# Patient Record
Sex: Female | Born: 1975 | Race: White | Hispanic: No | Marital: Married | Smoking: Never smoker
Health system: Southern US, Community
[De-identification: ages and names within clinical notes are randomized; demographics above are authoritative.]

## PROBLEM LIST (undated history)

## (undated) DIAGNOSIS — J349 Unspecified disorder of nose and nasal sinuses: Secondary | ICD-10-CM

## (undated) DIAGNOSIS — I10 Essential (primary) hypertension: Secondary | ICD-10-CM

## (undated) DIAGNOSIS — E8801 Alpha-1-antitrypsin deficiency: Secondary | ICD-10-CM

## (undated) DIAGNOSIS — R112 Nausea with vomiting, unspecified: Secondary | ICD-10-CM

## (undated) DIAGNOSIS — G43909 Migraine, unspecified, not intractable, without status migrainosus: Secondary | ICD-10-CM

## (undated) HISTORY — PX: WISDOM TOOTH EXTRACTION: SHX21

## (undated) HISTORY — DX: Unspecified disorder of nose and nasal sinuses: J34.9

## (undated) HISTORY — PX: BREAST BIOPSY: SHX20

---

## 2006-08-16 ENCOUNTER — Inpatient Hospital Stay (HOSPITAL_COMMUNITY): Admission: AD | Admit: 2006-08-16 | Discharge: 2006-08-18 | Payer: Self-pay | Admitting: Obstetrics and Gynecology

## 2008-04-29 DIAGNOSIS — C4491 Basal cell carcinoma of skin, unspecified: Secondary | ICD-10-CM

## 2008-04-29 HISTORY — DX: Basal cell carcinoma of skin, unspecified: C44.91

## 2008-05-04 ENCOUNTER — Ambulatory Visit: Payer: Self-pay | Admitting: Family Medicine

## 2009-01-24 DIAGNOSIS — D239 Other benign neoplasm of skin, unspecified: Secondary | ICD-10-CM

## 2009-01-24 HISTORY — DX: Other benign neoplasm of skin, unspecified: D23.9

## 2009-05-27 HISTORY — PX: NASAL SINUS SURGERY: SHX719

## 2010-01-25 ENCOUNTER — Ambulatory Visit: Payer: Self-pay | Admitting: Otolaryngology

## 2010-05-27 HISTORY — PX: BREAST BIOPSY: SHX20

## 2010-11-15 ENCOUNTER — Ambulatory Visit: Payer: Self-pay | Admitting: Internal Medicine

## 2010-12-24 ENCOUNTER — Other Ambulatory Visit: Payer: Self-pay | Admitting: Obstetrics and Gynecology

## 2010-12-24 DIAGNOSIS — R928 Other abnormal and inconclusive findings on diagnostic imaging of breast: Secondary | ICD-10-CM

## 2011-01-03 ENCOUNTER — Ambulatory Visit
Admission: RE | Admit: 2011-01-03 | Discharge: 2011-01-03 | Disposition: A | Discharge status: Home / Self Care | Payer: BC Managed Care – PPO | Source: Ambulatory Visit | Attending: Obstetrics and Gynecology | Admitting: Obstetrics and Gynecology

## 2011-01-03 DIAGNOSIS — R928 Other abnormal and inconclusive findings on diagnostic imaging of breast: Secondary | ICD-10-CM

## 2011-01-08 ENCOUNTER — Other Ambulatory Visit: Payer: Self-pay | Admitting: Obstetrics and Gynecology

## 2011-01-08 DIAGNOSIS — D249 Benign neoplasm of unspecified breast: Secondary | ICD-10-CM

## 2011-01-15 ENCOUNTER — Ambulatory Visit
Admission: RE | Admit: 2011-01-15 | Discharge: 2011-01-15 | Disposition: A | Discharge status: Home / Self Care | Payer: BC Managed Care – PPO | Source: Ambulatory Visit | Attending: Obstetrics and Gynecology | Admitting: Obstetrics and Gynecology

## 2011-01-15 DIAGNOSIS — D249 Benign neoplasm of unspecified breast: Secondary | ICD-10-CM

## 2011-08-06 LAB — HM PAP SMEAR

## 2011-10-06 LAB — HM MAMMOGRAPHY: HM Mammogram: NORMAL

## 2012-03-16 ENCOUNTER — Encounter: Payer: Self-pay | Admitting: Internal Medicine

## 2012-04-04 IMAGING — US US CORE BIOPSY
1 series · 8 of 8 positions shown · non-contrast
Comparison: none

***ADDENDUM*** CREATED: 01/17/2011 [DATE]

Pathology revealed a fibroadenoma in the left breast. This was
found to be concordant by Dr. Nelia Salie. Pathology was
relayed by telephone. The patient reported doing well after the
biopsy. Post biopsy instructions were reviewed and her questions
were answered. She was encouraged to call The [REDACTED] for any additional concerns. She was asked to
continue with monthly self breast examination and to return at age
39 for screening mammography.
Pathology results are dictated by Charalambia Saparilla RN, BSN on [address]
***END ADDENDUM*** SIGNED BY: Olefhile Koitsiwe, M.D.
CLINICAL DATA: Left breast mass, probable fibroadenoma.  The
patient requests biopsy.

[Series 1: us core biopsy · 8 of 8 slices shown]
[im 1/8]
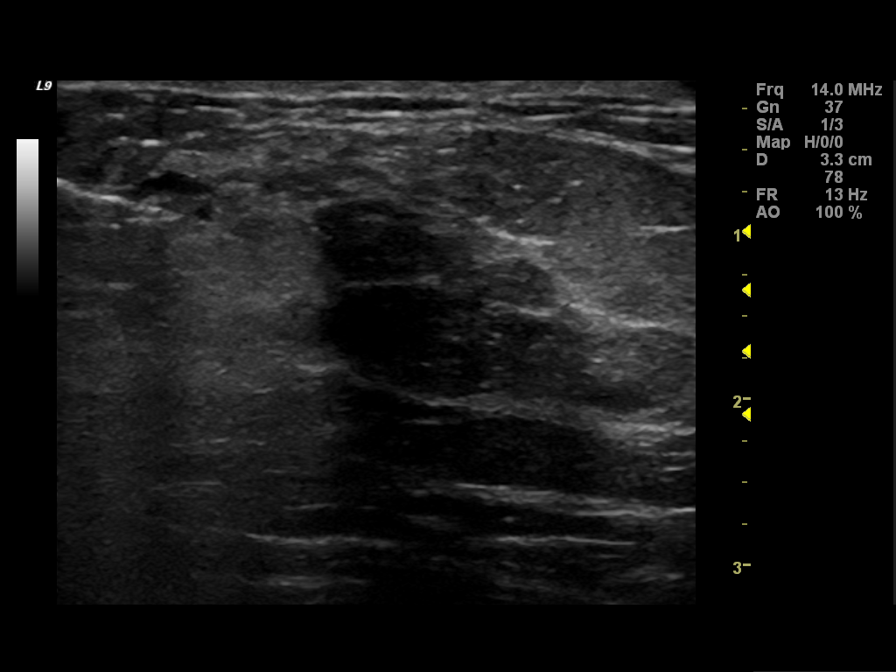
[im 2/8]
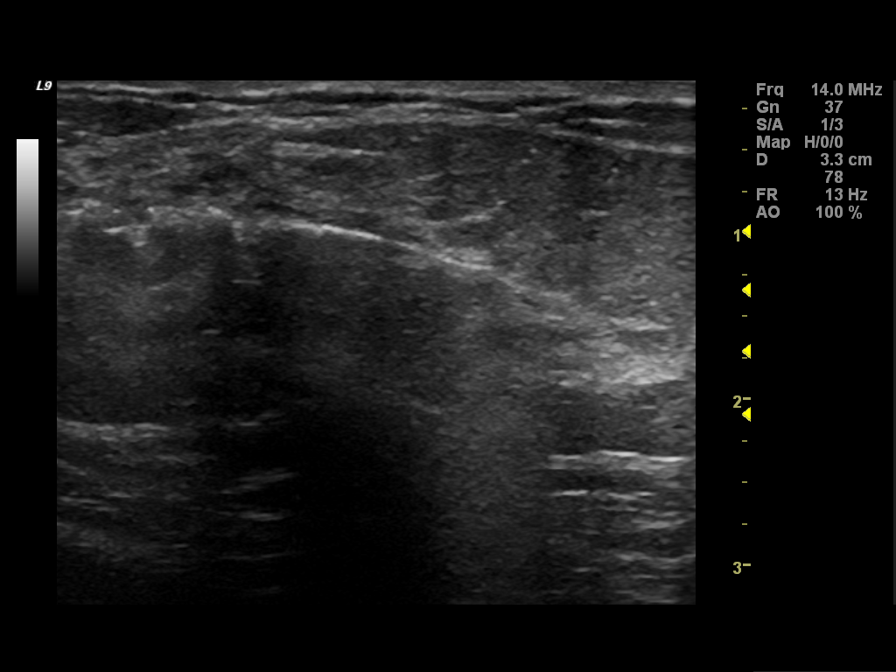
[im 3/8]
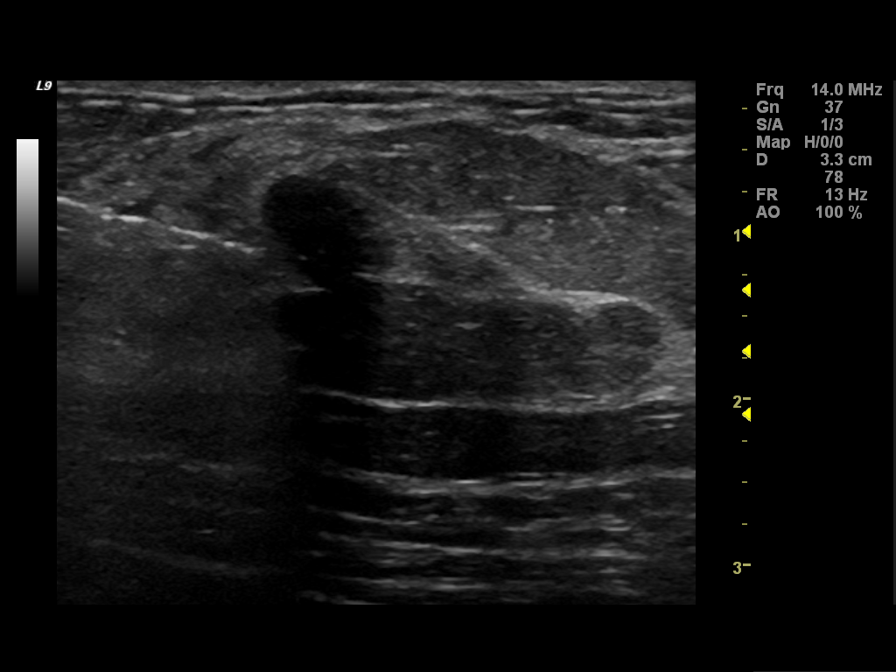
[im 4/8]
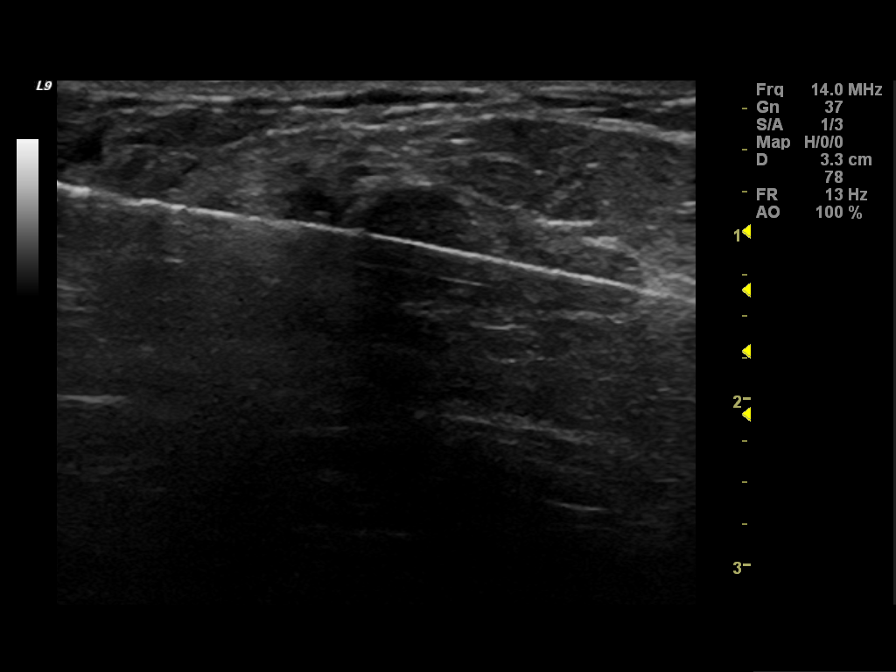
[im 5/8]
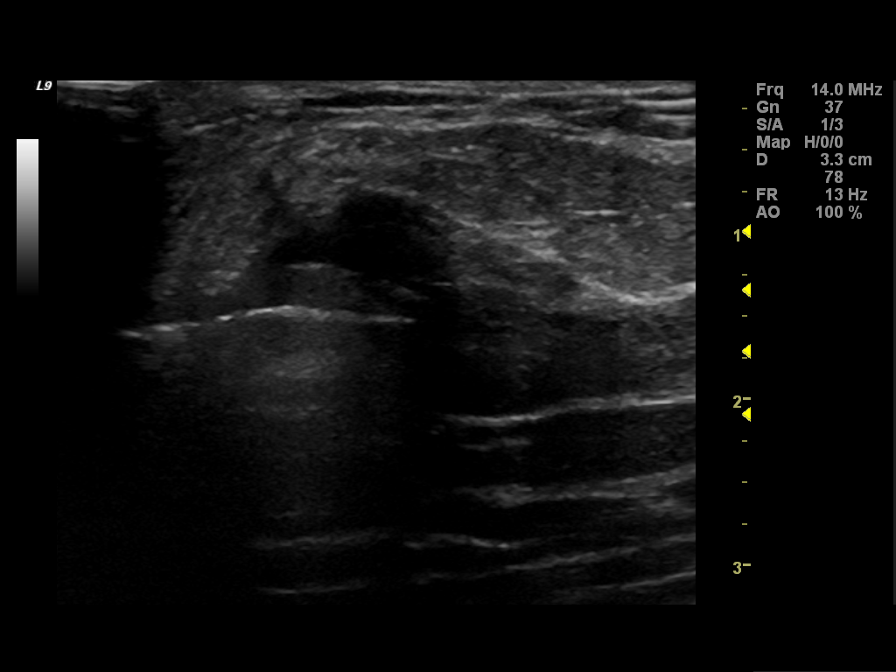
[im 6/8]
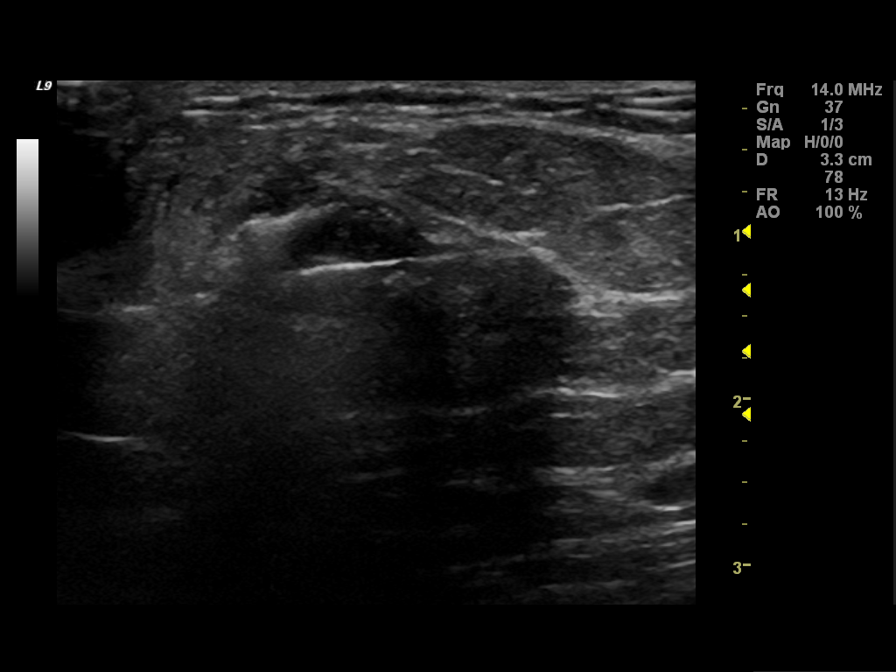
[im 7/8]
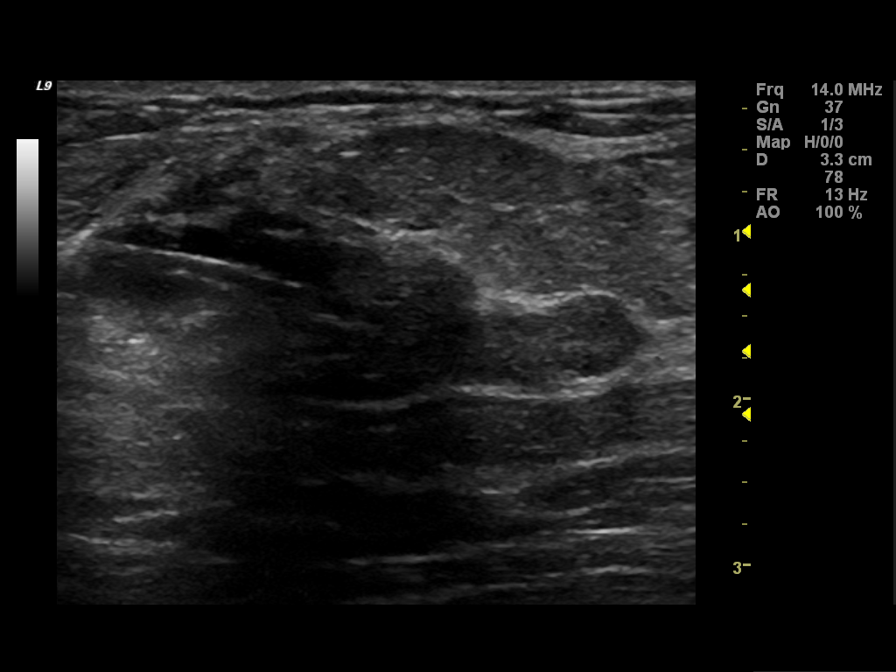
[im 8/8]
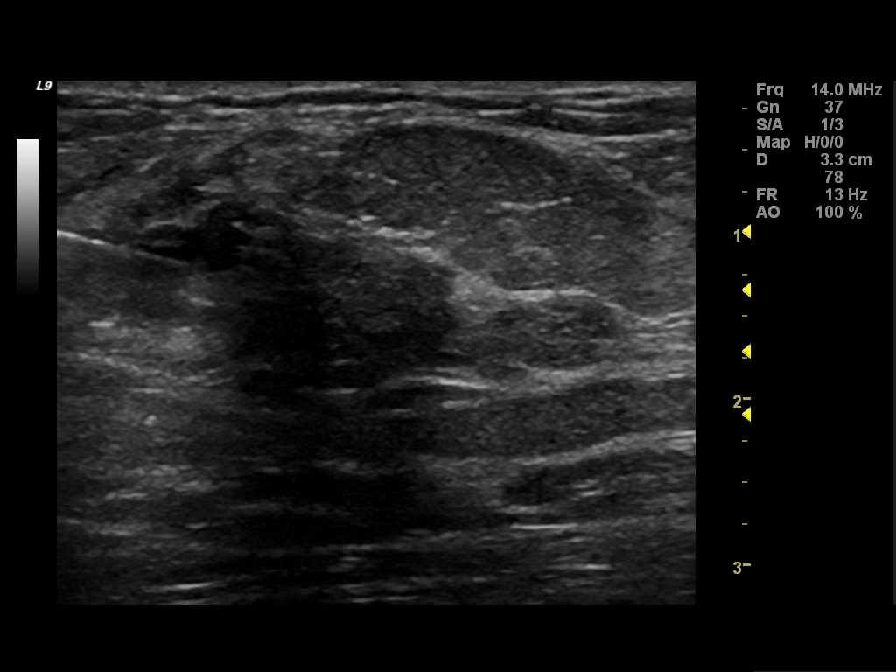

[8 of 8 positions shown; findings below may reference images not displayed]

ULTRASOUND GUIDED CORE BIOPSY OF THE LEFT BREAST

I met with the patient, and we discussed the procedure of
ultrasound-guided biopsy, including risks, benefits, and
alternatives.  Specifically, we discussed the risks of infection,
bleeding, tissue injury, clip migration, and inadequate sampling.
Informed, written consent was given.

Using sterile technique,   lidocaine, ultrasound guidance, and a 14
gauge automated biopsy device, biopsy was performed of mass in the
left breast nine o'clock position.  At the conclusion of the
procedure, a tissue marker clip was deployed into the biopsy
cavity.  Follow-up 2-view mammogram was performed and dictated
separately.
IMPRESSION: Ultrasound-guided biopsy of left breast.  No apparent
complications.

## 2012-08-05 ENCOUNTER — Encounter: Payer: Self-pay | Admitting: Internal Medicine

## 2012-08-05 ENCOUNTER — Ambulatory Visit (INDEPENDENT_AMBULATORY_CARE_PROVIDER_SITE_OTHER): Payer: BC Managed Care – PPO | Admitting: Internal Medicine

## 2012-08-05 VITALS — BP 132/98 | HR 88 | Temp 98.2°F | Ht 63.0 in | Wt 169.0 lb

## 2012-08-05 DIAGNOSIS — J309 Allergic rhinitis, unspecified: Secondary | ICD-10-CM

## 2012-08-05 DIAGNOSIS — Z0000 Encounter for general adult medical examination without abnormal findings: Secondary | ICD-10-CM

## 2012-08-05 DIAGNOSIS — R030 Elevated blood-pressure reading, without diagnosis of hypertension: Secondary | ICD-10-CM

## 2012-08-05 LAB — CBC WITH DIFFERENTIAL/PLATELET
Basophils Absolute: 0.1 10*3/uL (ref 0.0–0.1)
Eosinophils Absolute: 0.6 10*3/uL (ref 0.0–0.7)
Lymphocytes Relative: 33.7 % (ref 12.0–46.0)
MCHC: 33.7 g/dL (ref 30.0–36.0)
Neutrophils Relative %: 50.5 % (ref 43.0–77.0)
Platelets: 347 10*3/uL (ref 150.0–400.0)
RDW: 13.1 % (ref 11.5–14.6)

## 2012-08-05 LAB — LIPID PANEL
HDL: 36.3 mg/dL — ABNORMAL LOW (ref >39.00)
Total CHOL/HDL Ratio: 5
VLDL: 35.4 mg/dL (ref 0.0–40.0)

## 2012-08-05 LAB — TSH: TSH: 0.98 u[IU]/mL (ref 0.35–5.50)

## 2012-08-05 LAB — MICROALBUMIN / CREATININE URINE RATIO
Creatinine,U: 298.1 mg/dL
Microalb Creat Ratio: 0.8 mg/g (ref 0.0–30.0)
Microalb, Ur: 2.3 mg/dL — ABNORMAL HIGH (ref 0.0–1.9)

## 2012-08-05 LAB — COMPREHENSIVE METABOLIC PANEL
ALT: 36 U/L — ABNORMAL HIGH (ref 0–35)
AST: 36 U/L (ref 0–37)
Calcium: 9.2 mg/dL (ref 8.4–10.5)
Chloride: 106 mEq/L (ref 96–112)
Creatinine, Ser: 1 mg/dL (ref 0.4–1.2)
Sodium: 138 mEq/L (ref 135–145)
Total Bilirubin: 0.6 mg/dL (ref 0.3–1.2)
Total Protein: 7.6 g/dL (ref 6.0–8.3)

## 2012-08-05 MED ORDER — AZELASTINE-FLUTICASONE 137-50 MCG/ACT NA SUSP: 1.0000 | Freq: Every day | NASAL | Status: DC

## 2012-08-05 NOTE — Progress Notes (Signed)
Subjective:    Patient ID: Barbara Ortega, female    DOB: February 21, 1976, 37 y.o.   MRN: [id number]  HPI 37 year old female with history of allergic rhinitis presents to reestablish care. She was last seen in her previous office in 2012. She reports she is generally been doing well. However, over the last couple of months she has had increased symptoms of allergic rhinitis with clear nasal drainage, postnasal drip, and dry cough. The symptoms are exacerbated when working outdoors and with exercise. She occasionally notes some wheezing. She does not have a history of asthma. She denies shortness of breath. She is not a smoker.  She also notes that she was recently seen by her OB/GYN and was told that her blood pressure was slightly elevated. Her mother has a history of hypertension. When checking her blood pressure at home, it is typically 120s over 80s to 90s. She denies any headache, palpitation, chest pain.  Outpatient Encounter Prescriptions as of 08/05/2012  Medication Sig Dispense Refill  . etonogestrel-ethinyl estradiol (NUVARING) 0.12-0.015 MG/24HR vaginal ring Place 1 each vaginally every 28 (twenty-eight) days. Insert vaginally and leave in place for 3 consecutive weeks, then remove for 1 week.      . [DISCONTINUED] fluticasone (FLONASE) 50 MCG/ACT nasal spray Place 2 sprays into the nose daily.      . Azelastine-Fluticasone 137-50 MCG/ACT SUSP Place 1 spray into the nose daily.  23 g  3   No facility-administered encounter medications on file as of 08/05/2012.   BP 132/98  Pulse 88  Temp(Src) 98.2 F (36.8 C) (Oral)  Ht 5\' 3"  (1.6 m)  Wt 169 lb (76.658 kg)  BMI 29.94 kg/m2  SpO2 98%  LMP 08/02/2012  Review of Systems  Constitutional: Negative for fever, chills, appetite change, fatigue and unexpected weight change.  HENT: Positive for rhinorrhea, sneezing and postnasal drip. Negative for ear pain, congestion, sore throat, trouble swallowing, neck pain, voice change and sinus  pressure.   Eyes: Negative for visual disturbance.  Respiratory: Positive for cough and wheezing. Negative for shortness of breath and stridor.   Cardiovascular: Negative for chest pain, palpitations and leg swelling.  Gastrointestinal: Negative for nausea, vomiting, abdominal pain, diarrhea, constipation, blood in stool, abdominal distention and anal bleeding.  Genitourinary: Negative for dysuria and flank pain.  Musculoskeletal: Negative for myalgias, arthralgias and gait problem.  Skin: Negative for color change and rash.  Neurological: Negative for dizziness and headaches.  Hematological: Negative for adenopathy. Does not bruise/bleed easily.  Psychiatric/Behavioral: Negative for suicidal ideas, sleep disturbance and dysphoric mood. The patient is not nervous/anxious.        Objective:   Physical Exam  Constitutional: She is oriented to person, place, and time. She appears well-developed and well-nourished. No distress.  HENT:  Head: Normocephalic and atraumatic.  Right Ear: Tympanic membrane, external ear and ear canal normal.  Left Ear: Tympanic membrane, external ear and ear canal normal.  Nose: Mucosal edema present.  Mouth/Throat: Oropharynx is clear and moist. No oropharyngeal exudate.  Eyes: Conjunctivae are normal. Pupils are equal, round, and reactive to light. Right eye exhibits no discharge. Left eye exhibits no discharge. No scleral icterus.  Neck: Normal range of motion. Neck supple. No tracheal deviation present. No thyromegaly present.  Cardiovascular: Normal rate, regular rhythm, normal heart sounds and intact distal pulses.  Exam reveals no gallop and no friction rub.   No murmur heard. Pulmonary/Chest: Effort normal and breath sounds normal. No respiratory distress. She has no wheezes. She has  no rales. She exhibits no tenderness.  Musculoskeletal: Normal range of motion. She exhibits no edema and no tenderness.  Lymphadenopathy:    She has no cervical adenopathy.   Neurological: She is alert and oriented to person, place, and time. No cranial nerve deficit. She exhibits normal muscle tone. Coordination normal.  Skin: Skin is warm and dry. No rash noted. She is not diaphoretic. No erythema. No pallor.  Psychiatric: She has a normal mood and affect. Her behavior is normal. Judgment and thought content normal.          Assessment & Plan:

## 2012-08-05 NOTE — Assessment & Plan Note (Signed)
Symptoms and exam are most consistent with allergic rhinitis. No improvement with Claritin, Zyrtec or Flonase. Will try changing to Dymysta. If no improvement, will set up repeat ENT evaluation and allergy testing.

## 2012-08-05 NOTE — Assessment & Plan Note (Signed)
Blood pressure noted to be elevated today and was reportedly elevated at her OB/GYN appointment. We'll have her continue to monitor at home. Will check renal function and urine microalbumin with labs today. Followup in one month for recheck.

## 2012-09-07 ENCOUNTER — Ambulatory Visit (INDEPENDENT_AMBULATORY_CARE_PROVIDER_SITE_OTHER): Payer: BC Managed Care – PPO | Admitting: Internal Medicine

## 2012-09-07 ENCOUNTER — Encounter: Payer: Self-pay | Admitting: Internal Medicine

## 2012-09-07 VITALS — BP 144/98 | HR 90 | Temp 98.7°F | Wt 171.0 lb

## 2012-09-07 DIAGNOSIS — I10 Essential (primary) hypertension: Secondary | ICD-10-CM | POA: Insufficient documentation

## 2012-09-07 DIAGNOSIS — J309 Allergic rhinitis, unspecified: Secondary | ICD-10-CM

## 2012-09-07 DIAGNOSIS — R030 Elevated blood-pressure reading, without diagnosis of hypertension: Secondary | ICD-10-CM

## 2012-09-07 MED ORDER — HYDROCHLOROTHIAZIDE 12.5 MG PO CAPS: 12.5000 mg | ORAL_CAPSULE | Freq: Every day | ORAL | Status: DC

## 2012-09-07 NOTE — Assessment & Plan Note (Signed)
Persistent symptoms despite use of nonsedating antihistamine and nasal steroid. Will set up reevaluation with ENT. Question if she may have sinus obstruction leading to persistent symptoms.

## 2012-09-07 NOTE — Assessment & Plan Note (Signed)
BP Readings from Last 3 Encounters:  09/07/12 144/98  08/05/12 132/98   Blood pressure, specifically diastolic blood pressure has been elevated both in clinic and on check at home. Patient also has lower extremity edema. Will start low-dose hydrochlorothiazide. We'll also get renal artery Doppler given early onset of elevated blood pressures. Followup in one month for recheck.

## 2012-09-07 NOTE — Progress Notes (Signed)
Subjective:    Patient ID: Barbara Ortega, female    DOB: [birth year], 37 y.o.   MRN: [id number]  HPI 37 year old female with history of allergic rhinitis and elevated blood pressure presents for followup. At her last visit, she noted frequent symptoms of sneezing, nasal congestion. She has tried using several antihistamines including Zyrtec, Claritin, and Allegra. She reports no improvement with this. At her last visit, we also tried using the Dymista. She also notes no improvement with this. In the past, she was evaluated by ENT physician and had allergy testing which she reports was normal except for single allergy to grass. She reports persistent nasal congestion with occasional purulent sinus drainage. She also has occasional cough which is made worse by physical activity.  In regards to elevated blood pressure, she notes blood pressures typically 120s to 140s over 90-100. She denies any chest pain, headache, palpitations.  Outpatient Encounter Prescriptions as of 09/07/2012  Medication Sig Dispense Refill  . Azelastine-Fluticasone 137-50 MCG/ACT SUSP Place 1 spray into the nose daily.  23 g  3  . cetirizine (ZYRTEC) 10 MG tablet Take 10 mg by mouth daily.      Marland Kitchen etonogestrel-ethinyl estradiol (NUVARING) 0.12-0.015 MG/24HR vaginal ring Place 1 each vaginally every 28 (twenty-eight) days. Insert vaginally and leave in place for 3 consecutive weeks, then remove for 1 week.      . hydrochlorothiazide (MICROZIDE) 12.5 MG capsule Take 1 capsule (12.5 mg total) by mouth daily.  30 capsule  6   No facility-administered encounter medications on file as of 09/07/2012.   BP 144/98  Pulse 90  Temp(Src) 98.7 F (37.1 C) (Oral)  Wt 171 lb (77.565 kg)  BMI 30.3 kg/m2  SpO2 99%  LMP 08/31/2012  Review of Systems  Constitutional: Negative for fever, chills, appetite change, fatigue and unexpected weight change.  HENT: Positive for congestion, rhinorrhea, sneezing and postnasal drip. Negative for  ear pain, sore throat, trouble swallowing, neck pain, voice change and sinus pressure.   Eyes: Negative for visual disturbance.  Respiratory: Negative for cough, shortness of breath, wheezing and stridor.   Cardiovascular: Negative for chest pain, palpitations and leg swelling.  Gastrointestinal: Negative for nausea, vomiting, abdominal pain, diarrhea, constipation, blood in stool, abdominal distention and anal bleeding.  Genitourinary: Negative for dysuria and flank pain.  Musculoskeletal: Negative for myalgias, arthralgias and gait problem.  Skin: Negative for color change and rash.  Neurological: Negative for dizziness and headaches.  Hematological: Negative for adenopathy. Does not bruise/bleed easily.  Psychiatric/Behavioral: Negative for suicidal ideas, sleep disturbance and dysphoric mood. The patient is not nervous/anxious.        Objective:   Physical Exam  Constitutional: She is oriented to person, place, and time. She appears well-developed and well-nourished. No distress.  HENT:  Head: Normocephalic and atraumatic.  Right Ear: External ear normal.  Left Ear: External ear normal.  Nose: Mucosal edema present. Right sinus exhibits no maxillary sinus tenderness and no frontal sinus tenderness. Left sinus exhibits no maxillary sinus tenderness and no frontal sinus tenderness.  Mouth/Throat: Oropharynx is clear and moist. No oropharyngeal exudate.  Eyes: Conjunctivae are normal. Pupils are equal, round, and reactive to light. Right eye exhibits no discharge. Left eye exhibits no discharge. No scleral icterus.  Neck: Normal range of motion. Neck supple. No tracheal deviation present. No thyromegaly present.  Cardiovascular: Normal rate, regular rhythm, normal heart sounds and intact distal pulses.  Exam reveals no gallop and no friction rub.   No murmur heard.  Pulmonary/Chest: Effort normal and breath sounds normal. No respiratory distress. She has no wheezes. She has no rales. She  exhibits no tenderness.  Musculoskeletal: Normal range of motion. She exhibits no edema and no tenderness.  Lymphadenopathy:    She has no cervical adenopathy.  Neurological: She is alert and oriented to person, place, and time. No cranial nerve deficit. She exhibits normal muscle tone. Coordination normal.  Skin: Skin is warm and dry. No rash noted. She is not diaphoretic. No erythema. No pallor.  Psychiatric: She has a normal mood and affect. Her behavior is normal. Judgment and thought content normal.          Assessment & Plan:

## 2012-09-18 ENCOUNTER — Telehealth: Payer: Self-pay | Admitting: Internal Medicine

## 2012-09-18 NOTE — Telephone Encounter (Signed)
Ultrasound of the renal arteries 4/23 was normal.

## 2012-09-25 LAB — HM MAMMOGRAPHY

## 2012-09-25 LAB — HM PAP SMEAR

## 2012-10-05 ENCOUNTER — Encounter: Payer: Self-pay | Admitting: Internal Medicine

## 2012-10-05 ENCOUNTER — Ambulatory Visit (INDEPENDENT_AMBULATORY_CARE_PROVIDER_SITE_OTHER): Payer: BC Managed Care – PPO | Admitting: Internal Medicine

## 2012-10-05 VITALS — BP 130/90 | HR 89 | Temp 98.3°F | Wt 173.0 lb

## 2012-10-05 DIAGNOSIS — I10 Essential (primary) hypertension: Secondary | ICD-10-CM

## 2012-10-05 NOTE — Progress Notes (Signed)
Subjective:    Patient ID: Barbara Ortega, female    DOB: 1975-06-10, 37 y.o.   MRN: [id number]  HPI 37YO female with h/o hypertension presents for follow up. Doing well. Tolerated HCTZ well. Notes improvement in frequency and intensity of headaches. BP generally 120-130/90s. No new concerns today.  Outpatient Encounter Prescriptions as of 10/05/2012  Medication Sig Dispense Refill  . Azelastine-Fluticasone 137-50 MCG/ACT SUSP Place 1 spray into the nose daily.  23 g  3  . cetirizine (ZYRTEC) 10 MG tablet Take 10 mg by mouth daily.      Marland Kitchen etonogestrel-ethinyl estradiol (NUVARING) 0.12-0.015 MG/24HR vaginal ring Place 1 each vaginally every 28 (twenty-eight) days. Insert vaginally and leave in place for 3 consecutive weeks, then remove for 1 week.      . hydrochlorothiazide (MICROZIDE) 12.5 MG capsule Take 1 capsule (12.5 mg total) by mouth daily.  30 capsule  6   No facility-administered encounter medications on file as of 10/05/2012.   BP 130/90  Pulse 89  Temp(Src) 98.3 F (36.8 C) (Oral)  Wt 173 lb (78.472 kg)  BMI 30.65 kg/m2  SpO2 97%  LMP 08/31/2012  Review of Systems  Constitutional: Negative for fever, chills, appetite change, fatigue and unexpected weight change.  HENT: Negative for ear pain, congestion, sore throat, trouble swallowing, neck pain, voice change and sinus pressure.   Eyes: Negative for visual disturbance.  Respiratory: Negative for cough, shortness of breath, wheezing and stridor.   Cardiovascular: Negative for chest pain, palpitations and leg swelling.  Gastrointestinal: Negative for nausea, vomiting, abdominal pain, diarrhea, constipation, blood in stool, abdominal distention and anal bleeding.  Genitourinary: Negative for dysuria and flank pain.  Musculoskeletal: Negative for myalgias, arthralgias and gait problem.  Skin: Negative for color change and rash.  Neurological: Negative for dizziness and headaches.  Hematological: Negative for adenopathy.  Does not bruise/bleed easily.  Psychiatric/Behavioral: Negative for suicidal ideas, sleep disturbance and dysphoric mood. The patient is not nervous/anxious.        Objective:   Physical Exam  Constitutional: She is oriented to person, place, and time. She appears well-developed and well-nourished. No distress.  HENT:  Head: Normocephalic and atraumatic.  Right Ear: External ear normal.  Left Ear: External ear normal.  Nose: Nose normal.  Mouth/Throat: Oropharynx is clear and moist. No oropharyngeal exudate.  Eyes: Conjunctivae are normal. Pupils are equal, round, and reactive to light. Right eye exhibits no discharge. Left eye exhibits no discharge. No scleral icterus.  Neck: Normal range of motion. Neck supple. No tracheal deviation present. No thyromegaly present.  Cardiovascular: Normal rate, regular rhythm, normal heart sounds and intact distal pulses.  Exam reveals no gallop and no friction rub.   No murmur heard. Pulmonary/Chest: Effort normal and breath sounds normal. No accessory muscle usage. Not tachypneic. No respiratory distress. She has no decreased breath sounds. She has no wheezes. She has no rhonchi. She has no rales. She exhibits no tenderness.  Musculoskeletal: Normal range of motion. She exhibits no edema and no tenderness.  Lymphadenopathy:    She has no cervical adenopathy.  Neurological: She is alert and oriented to person, place, and time. No cranial nerve deficit. She exhibits normal muscle tone. Coordination normal.  Skin: Skin is warm and dry. No rash noted. She is not diaphoretic. No erythema. No pallor.  Psychiatric: She has a normal mood and affect. Her behavior is normal. Judgment and thought content normal.          Assessment & Plan:

## 2012-10-05 NOTE — Assessment & Plan Note (Signed)
BP generally improved with use of HCTZ. Recent labs including renal function normal. Renal artery doppler normal. Will continue HCTZ and monitor BP. Pt will call if BP increasing consistently >140/90. Plan to repeat CMP and urine microalbumin in 3-6 months.

## 2012-10-12 ENCOUNTER — Encounter: Payer: Self-pay | Admitting: Internal Medicine

## 2013-01-15 ENCOUNTER — Other Ambulatory Visit: Payer: Self-pay

## 2013-01-15 DIAGNOSIS — Z1231 Encounter for screening mammogram for malignant neoplasm of breast: Secondary | ICD-10-CM

## 2013-02-15 ENCOUNTER — Ambulatory Visit
Admission: RE | Admit: 2013-02-15 | Discharge: 2013-02-15 | Disposition: A | Discharge status: Home / Self Care | Payer: BC Managed Care – PPO | Source: Ambulatory Visit

## 2013-02-15 DIAGNOSIS — Z1231 Encounter for screening mammogram for malignant neoplasm of breast: Secondary | ICD-10-CM

## 2013-04-01 ENCOUNTER — Other Ambulatory Visit: Payer: Self-pay

## 2013-04-12 ENCOUNTER — Other Ambulatory Visit (INDEPENDENT_AMBULATORY_CARE_PROVIDER_SITE_OTHER): Payer: BC Managed Care – PPO

## 2013-04-12 ENCOUNTER — Ambulatory Visit: Payer: Self-pay

## 2013-04-12 DIAGNOSIS — Z23 Encounter for immunization: Secondary | ICD-10-CM

## 2013-04-12 DIAGNOSIS — I10 Essential (primary) hypertension: Secondary | ICD-10-CM

## 2013-04-12 LAB — COMPREHENSIVE METABOLIC PANEL
ALT: 63 U/L — ABNORMAL HIGH (ref 0–35)
AST: 42 U/L — ABNORMAL HIGH (ref 0–37)
CO2: 27 mEq/L (ref 19–32)
Creatinine, Ser: 0.9 mg/dL (ref 0.4–1.2)
GFR: 72.55 mL/min (ref >60.00)
Total Bilirubin: 0.6 mg/dL (ref 0.3–1.2)

## 2013-04-12 LAB — MICROALBUMIN / CREATININE URINE RATIO: Microalb Creat Ratio: 0.6 mg/g (ref 0.0–30.0)

## 2013-04-14 ENCOUNTER — Ambulatory Visit: Payer: BC Managed Care – PPO | Admitting: Internal Medicine

## 2013-04-27 ENCOUNTER — Ambulatory Visit (INDEPENDENT_AMBULATORY_CARE_PROVIDER_SITE_OTHER): Payer: BC Managed Care – PPO | Admitting: Internal Medicine

## 2013-04-27 ENCOUNTER — Encounter: Payer: Self-pay | Admitting: Internal Medicine

## 2013-04-27 VITALS — BP 130/82 | HR 79 | Resp 12 | Ht 63.0 in | Wt 171.5 lb

## 2013-04-27 DIAGNOSIS — I10 Essential (primary) hypertension: Secondary | ICD-10-CM

## 2013-04-27 DIAGNOSIS — R7989 Other specified abnormal findings of blood chemistry: Secondary | ICD-10-CM | POA: Insufficient documentation

## 2013-04-27 MED ORDER — HYDROCHLOROTHIAZIDE 12.5 MG PO CAPS: 12.5000 mg | ORAL_CAPSULE | Freq: Every day | ORAL | Status: DC

## 2013-04-27 NOTE — Patient Instructions (Signed)
Repeat labs in 3 weeks.

## 2013-04-27 NOTE — Progress Notes (Signed)
Subjective:    Patient ID: Barbara Ortega, female    DOB: 10-02-1975, 37 y.o.   MRN: [id number]  HPI 37YO female with HTN presents for follow up. Feeling well. Compliant with medications. No chest pain, headache. Recently had labs which showed mild elevation of LFTs. Notes occasional nausea after eating dinner, but this is rare. Denies abdominal pain.  Outpatient Encounter Prescriptions as of 04/27/2013  Medication Sig  . Azelastine-Fluticasone 137-50 MCG/ACT SUSP Place 1 spray into the nose daily.  . cetirizine (ZYRTEC) 10 MG tablet Take 10 mg by mouth daily.  Marland Kitchen etonogestrel-ethinyl estradiol (NUVARING) 0.12-0.015 MG/24HR vaginal ring Place 1 each vaginally every 28 (twenty-eight) days. Insert vaginally and leave in place for 3 consecutive weeks, then remove for 1 week.  . hydrochlorothiazide (MICROZIDE) 12.5 MG capsule Take 1 capsule (12.5 mg total) by mouth daily.   BP 130/82  Pulse 79  Resp 12  Ht 5\' 3"  (1.6 m)  Wt 171 lb 8 oz (77.792 kg)  BMI 30.39 kg/m2  SpO2 98%  Review of Systems  Constitutional: Negative for fever, chills, appetite change, fatigue and unexpected weight change.  HENT: Negative for congestion, ear pain, sinus pressure, sore throat, trouble swallowing and voice change.   Eyes: Negative for visual disturbance.  Respiratory: Negative for cough, shortness of breath, wheezing and stridor.   Cardiovascular: Negative for chest pain, palpitations and leg swelling.  Gastrointestinal: Positive for nausea (occasional). Negative for vomiting, abdominal pain, diarrhea, constipation, blood in stool, abdominal distention and anal bleeding.  Genitourinary: Negative for dysuria and flank pain.  Musculoskeletal: Negative for arthralgias, gait problem, myalgias and neck pain.  Skin: Negative for color change and rash.  Neurological: Negative for dizziness and headaches.  Hematological: Negative for adenopathy. Does not bruise/bleed easily.  Psychiatric/Behavioral: Negative for  suicidal ideas, sleep disturbance and dysphoric mood. The patient is not nervous/anxious.        Objective:   Physical Exam  Constitutional: She is oriented to person, place, and time. She appears well-developed and well-nourished. No distress.  HENT:  Head: Normocephalic and atraumatic.  Right Ear: External ear normal.  Left Ear: External ear normal.  Nose: Nose normal.  Mouth/Throat: Oropharynx is clear and moist. No oropharyngeal exudate.  Eyes: Conjunctivae are normal. Pupils are equal, round, and reactive to light. Right eye exhibits no discharge. Left eye exhibits no discharge. No scleral icterus.  Neck: Normal range of motion. Neck supple. No tracheal deviation present. No thyromegaly present.  Cardiovascular: Normal rate, regular rhythm, normal heart sounds and intact distal pulses.  Exam reveals no gallop and no friction rub.   No murmur heard. Pulmonary/Chest: Effort normal and breath sounds normal. No accessory muscle usage. Not tachypneic. No respiratory distress. She has no decreased breath sounds. She has no wheezes. She has no rhonchi. She has no rales. She exhibits no tenderness.  Abdominal: Soft. Bowel sounds are normal. She exhibits no distension. There is no hepatosplenomegaly. There is no tenderness.  Musculoskeletal: Normal range of motion. She exhibits no edema and no tenderness.  Lymphadenopathy:    She has no cervical adenopathy.  Neurological: She is alert and oriented to person, place, and time. No cranial nerve deficit. She exhibits normal muscle tone. Coordination normal.  Skin: Skin is warm and dry. No rash noted. She is not diaphoretic. No erythema. No pallor.  Psychiatric: She has a normal mood and affect. Her behavior is normal. Judgment and thought content normal.          Assessment &  Plan:

## 2013-04-27 NOTE — Assessment & Plan Note (Signed)
BP Readings from Last 3 Encounters:  04/27/13 130/82  10/05/12 130/90  09/07/12 144/98   BP well controlled on HCTZ. Recent renal function and urine microalbumin were normal. Will continue HCTZ. Pt will call if BP consistently >140/90 at home.

## 2013-04-27 NOTE — Assessment & Plan Note (Addendum)
LFTs mildly elevated on labs. Discussed potential causes of this. Will plan to recheck CMP in 3 weeks. We discussed getting a liver doppler if LFTs persistently elevated. Pt will also call if she develops any abdominal pain, persistent nausea, or other concerns.

## 2013-04-27 NOTE — Progress Notes (Signed)
Pre visit review using our clinic review tool, if applicable. No additional management support is needed unless otherwise documented below in the visit note. 

## 2013-05-17 ENCOUNTER — Telehealth: Payer: Self-pay | Admitting: Internal Medicine

## 2013-05-17 ENCOUNTER — Other Ambulatory Visit (INDEPENDENT_AMBULATORY_CARE_PROVIDER_SITE_OTHER): Payer: BC Managed Care – PPO

## 2013-05-17 DIAGNOSIS — R7989 Other specified abnormal findings of blood chemistry: Secondary | ICD-10-CM

## 2013-05-17 LAB — COMPREHENSIVE METABOLIC PANEL
AST: 40 U/L — ABNORMAL HIGH (ref 0–37)
Albumin: 3.9 g/dL (ref 3.5–5.2)
Alkaline Phosphatase: 58 U/L (ref 39–117)
BUN: 13 mg/dL (ref 6–23)
Potassium: 4 mEq/L (ref 3.5–5.1)

## 2013-05-17 NOTE — Telephone Encounter (Signed)
Left message for pt to return my call.

## 2013-05-17 NOTE — Telephone Encounter (Signed)
Dramamine is OTC. Does she mean Scopalamine?

## 2013-05-17 NOTE — Telephone Encounter (Signed)
Pt came in today to get labs and wanted to know if dr walker will call in dramine patches for a cruse She will be going next month [facility] [address] [place]

## 2013-05-18 ENCOUNTER — Other Ambulatory Visit: Payer: Self-pay | Admitting: Internal Medicine

## 2013-05-18 MED ORDER — SCOPOLAMINE 1 MG/3DAYS TD PT72: 1.0000 | MEDICATED_PATCH | TRANSDERMAL | Status: DC

## 2013-05-18 NOTE — Telephone Encounter (Signed)
Yes, scopolamine patches is what she is requesting

## 2013-05-24 ENCOUNTER — Ambulatory Visit: Payer: Self-pay | Admitting: Internal Medicine

## 2013-05-26 ENCOUNTER — Telehealth: Payer: Self-pay | Admitting: Internal Medicine

## 2013-05-26 DIAGNOSIS — K7581 Nonalcoholic steatohepatitis (NASH): Secondary | ICD-10-CM

## 2013-05-26 NOTE — Telephone Encounter (Signed)
Patient informed and would like for the referral to be placed

## 2013-05-26 NOTE — Telephone Encounter (Signed)
Recent ultrasound of the liver showed diffuse fatty infiltration of the liver. I would recommend that we set up evaluation with Dr. Clair Gulling at [facility], as she is an expert in this. Given pts young age, we will need to monitor this carefully. I would recommend a diet high in fiber and low in saturated fat and low in processed sugars.

## 2013-06-03 ENCOUNTER — Encounter: Payer: Self-pay | Admitting: Internal Medicine

## 2013-06-18 ENCOUNTER — Encounter: Payer: Self-pay | Admitting: Internal Medicine

## 2013-07-28 ENCOUNTER — Telehealth: Payer: Self-pay | Admitting: Internal Medicine

## 2013-07-28 ENCOUNTER — Ambulatory Visit: Payer: Self-pay | Admitting: Physician Assistant

## 2013-07-28 NOTE — Telephone Encounter (Signed)
Patient returned call and left message on voicemail. I returned her call and left message for her to return my call again, today before 5 or tomorrow after 8.

## 2013-07-28 NOTE — Telephone Encounter (Signed)
Left message to call back, need a little more information about what is going on. She was last seen on 12/2 by Dr. Gilford Rile.

## 2013-07-28 NOTE — Telephone Encounter (Signed)
The patient has had a migraine on and off since Friday 2.27.15. She is wanting to know if having a shot for her migraines still and option. Please advise.

## 2013-08-02 NOTE — Telephone Encounter (Signed)
As of today, no return call. Encounter has been closed.

## 2013-08-11 DIAGNOSIS — K760 Fatty (change of) liver, not elsewhere classified: Secondary | ICD-10-CM | POA: Insufficient documentation

## 2013-11-02 ENCOUNTER — Encounter: Payer: BC Managed Care – PPO | Admitting: Internal Medicine

## 2013-11-04 ENCOUNTER — Encounter: Payer: BC Managed Care – PPO | Admitting: Internal Medicine

## 2013-12-17 ENCOUNTER — Other Ambulatory Visit: Payer: Self-pay

## 2013-12-17 DIAGNOSIS — Z1231 Encounter for screening mammogram for malignant neoplasm of breast: Secondary | ICD-10-CM

## 2014-01-13 ENCOUNTER — Other Ambulatory Visit: Payer: Self-pay | Admitting: Obstetrics and Gynecology

## 2014-01-14 LAB — CYTOLOGY - PAP

## 2014-02-21 ENCOUNTER — Ambulatory Visit
Admission: RE | Admit: 2014-02-21 | Discharge: 2014-02-21 | Disposition: A | Discharge status: Home / Self Care | Payer: Managed Care, Other (non HMO) | Source: Ambulatory Visit

## 2014-02-21 DIAGNOSIS — Z1231 Encounter for screening mammogram for malignant neoplasm of breast: Secondary | ICD-10-CM

## 2014-06-29 ENCOUNTER — Emergency Department: Payer: Self-pay | Admitting: Emergency Medicine

## 2014-08-22 ENCOUNTER — Ambulatory Visit (INDEPENDENT_AMBULATORY_CARE_PROVIDER_SITE_OTHER): Payer: Managed Care, Other (non HMO) | Admitting: Internal Medicine

## 2014-08-22 ENCOUNTER — Encounter: Payer: Self-pay | Admitting: Internal Medicine

## 2014-08-22 VITALS — BP 146/89 | HR 74 | Temp 98.4°F | Ht 63.0 in | Wt 174.5 lb

## 2014-08-22 DIAGNOSIS — I10 Essential (primary) hypertension: Secondary | ICD-10-CM

## 2014-08-22 DIAGNOSIS — E8801 Alpha-1-antitrypsin deficiency: Secondary | ICD-10-CM | POA: Insufficient documentation

## 2014-08-22 DIAGNOSIS — K760 Fatty (change of) liver, not elsewhere classified: Secondary | ICD-10-CM

## 2014-08-22 DIAGNOSIS — G43109 Migraine with aura, not intractable, without status migrainosus: Secondary | ICD-10-CM | POA: Diagnosis not present

## 2014-08-22 LAB — COMPREHENSIVE METABOLIC PANEL
ALBUMIN: 4.1 g/dL (ref 3.5–5.2)
ALT: 29 U/L (ref 0–35)
AST: 20 U/L (ref 0–37)
Alkaline Phosphatase: 55 U/L (ref 39–117)
BUN: 12 mg/dL (ref 6–23)
CALCIUM: 10 mg/dL (ref 8.4–10.5)
CHLORIDE: 105 meq/L (ref 96–112)
CO2: 26 meq/L (ref 19–32)
CREATININE: 0.98 mg/dL (ref 0.40–1.20)
GFR: 66.97 mL/min (ref >60.00)
Glucose, Bld: 87 mg/dL (ref 70–99)
POTASSIUM: 3.8 meq/L (ref 3.5–5.1)
Sodium: 136 mEq/L (ref 135–145)
Total Bilirubin: 0.4 mg/dL (ref 0.2–1.2)
Total Protein: 7.5 g/dL (ref 6.0–8.3)

## 2014-08-22 MED ORDER — HYDROCHLOROTHIAZIDE 12.5 MG PO CAPS: 12.5000 mg | ORAL_CAPSULE | Freq: Every day | ORAL | Status: DC

## 2014-08-22 MED ORDER — SUMATRIPTAN SUCCINATE 50 MG PO TABS: 50.0000 mg | ORAL_TABLET | ORAL | Status: DC | PRN

## 2014-08-22 NOTE — Assessment & Plan Note (Signed)
BP Readings from Last 3 Encounters:  08/22/14 146/89  04/27/13 130/82  10/05/12 130/90   BP elevated today, however has been off meds. Will restart HCTZ. Follow up recheck in 4 weeks. Renal function with labs today.

## 2014-08-22 NOTE — Patient Instructions (Addendum)
Stop Nuvaring.   Please schedule follow up with OB to discuss IUD versus tubal ligation.  Please restart HCTZ 12.5mg  daily.  Labs today.  Follow up in 4 weeks.

## 2014-08-22 NOTE — Progress Notes (Signed)
Pre visit review using our clinic review tool, if applicable. No additional management support is needed unless otherwise documented below in the visit note. 

## 2014-08-22 NOTE — Assessment & Plan Note (Signed)
Reviewed notes from [place] including liver biopsy results. Encouraged Mediterranean style diet, exercise. Will recheck LFTs today.

## 2014-08-22 NOTE — Assessment & Plan Note (Signed)
Migraine with aura. Discussed absolute contraindication with OCP and Nuvaring. Will stop Nuvaring. She will discuss alternative options with GYN. Will continue Imitrex as this works well for her. Follow up in 4 weeks and prn.

## 2014-08-22 NOTE — Progress Notes (Signed)
Subjective:    Patient ID: Barbara Ortega, female    DOB: 01-23-1976, 39 y.o.   MRN: [id number]  HPI  39YO female presents for acute visit.  Recently had liver biopsy. Showed steatosis.  HTN - out of HCTZ for 1 week. No chest pain.  Headaches - Started having migraines about 1 year ago. Treated in past at urgent care with Imitrex. Normally, this works well. Sometimes has visual changes prior to migraine. Typically, starts with mild pain. No improvement with Tylenol. Typically right sided.  Occasional nausea or vomiting. Typically occur 1-2 times per month.  Past medical, surgical, family and social history per today's encounter.  Review of Systems  Constitutional: Negative for fever, chills, appetite change, fatigue and unexpected weight change.  Eyes: Positive for visual disturbance. Negative for photophobia.  Respiratory: Negative for shortness of breath.   Cardiovascular: Negative for chest pain and leg swelling.  Gastrointestinal: Negative for nausea, vomiting, abdominal pain, diarrhea and constipation.  Musculoskeletal: Negative for myalgias and arthralgias.  Skin: Negative for color change and rash.  Neurological: Positive for headaches. Negative for syncope, speech difficulty, weakness and numbness.  Hematological: Negative for adenopathy. Does not bruise/bleed easily.  Psychiatric/Behavioral: Negative for dysphoric mood. The patient is not nervous/anxious.        Objective:    BP 146/89 mmHg  Pulse 74  Temp(Src) 98.4 F (36.9 C) (Oral)  Ht 5\' 3"  (1.6 m)  Wt 174 lb 8 oz (79.153 kg)  BMI 30.92 kg/m2  SpO2 100%  LMP 08/01/2014 Physical Exam  Constitutional: She is oriented to person, place, and time. She appears well-developed and well-nourished. No distress.  HENT:  Head: Normocephalic and atraumatic.  Right Ear: External ear normal.  Left Ear: External ear normal.  Nose: Nose normal.  Mouth/Throat: Oropharynx is clear and moist. No oropharyngeal exudate.    Eyes: Conjunctivae are normal. Pupils are equal, round, and reactive to light. Right eye exhibits no discharge. Left eye exhibits no discharge. No scleral icterus.  Neck: Normal range of motion. Neck supple. No tracheal deviation present. No thyromegaly present.  Cardiovascular: Normal rate, regular rhythm, normal heart sounds and intact distal pulses.  Exam reveals no gallop and no friction rub.   No murmur heard. Pulmonary/Chest: Effort normal and breath sounds normal. No respiratory distress. She has no wheezes. She has no rales. She exhibits no tenderness.  Musculoskeletal: Normal range of motion. She exhibits no edema or tenderness.  Lymphadenopathy:    She has no cervical adenopathy.  Neurological: She is alert and oriented to person, place, and time. No cranial nerve deficit. She exhibits normal muscle tone. Coordination normal.  Skin: Skin is warm and dry. No rash noted. She is not diaphoretic. No erythema. No pallor.  Psychiatric: She has a normal mood and affect. Her behavior is normal. Judgment and thought content normal.          Assessment & Plan:   Problem List Items Addressed This Visit      Unprioritized   Alpha-1-antitrypsin deficiency    Reviewed notes from [facility] including liver biopsy and results pos for alpha-1-antitrypsin deficiency. Discussed importance of avoiding cigarette smoke.      Essential hypertension, benign - Primary    BP Readings from Last 3 Encounters:  08/22/14 146/89  04/27/13 130/82  10/05/12 130/90   BP elevated today, however has been off meds. Will restart HCTZ. Follow up recheck in 4 weeks. Renal function with labs today.      Relevant Medications  hydrochlorothiazide (MICROZIDE) 12.5 MG capsule   Other Relevant Orders   Comprehensive metabolic panel   Fatty infiltration of liver    Reviewed notes from [facility] including liver biopsy results. Encouraged Mediterranean style diet, exercise. Will recheck LFTs today.      Migraine with  aura    Migraine with aura. Discussed absolute contraindication with OCP and Nuvaring. Will stop Nuvaring. She will discuss alternative options with GYN. Will continue Imitrex as this works well for her. Follow up in 4 weeks and prn.      Relevant Medications   hydrochlorothiazide (MICROZIDE) 12.5 MG capsule   SUMAtriptan (IMITREX) tablet       Return in about 4 weeks (around 09/19/2014) for Recheck.

## 2014-08-22 NOTE — Assessment & Plan Note (Signed)
Reviewed notes from [place] including liver biopsy and results pos for alpha-1-antitrypsin deficiency. Discussed importance of avoiding cigarette smoke.

## 2014-09-21 ENCOUNTER — Encounter: Payer: Self-pay | Admitting: Internal Medicine

## 2014-09-21 ENCOUNTER — Ambulatory Visit (INDEPENDENT_AMBULATORY_CARE_PROVIDER_SITE_OTHER): Payer: Managed Care, Other (non HMO) | Admitting: Internal Medicine

## 2014-09-21 VITALS — BP 132/88 | HR 85 | Temp 98.3°F | Ht 63.0 in | Wt 173.0 lb

## 2014-09-21 DIAGNOSIS — I10 Essential (primary) hypertension: Secondary | ICD-10-CM

## 2014-09-21 DIAGNOSIS — G43109 Migraine with aura, not intractable, without status migrainosus: Secondary | ICD-10-CM | POA: Diagnosis not present

## 2014-09-21 DIAGNOSIS — Z309 Encounter for contraceptive management, unspecified: Secondary | ICD-10-CM | POA: Diagnosis not present

## 2014-09-21 NOTE — Assessment & Plan Note (Signed)
Scheduled to get IUD.

## 2014-09-21 NOTE — Assessment & Plan Note (Signed)
Migraines are well controlled with Imitrex. Will continue. Follow up prn.

## 2014-09-21 NOTE — Patient Instructions (Signed)
Follow up in 6 months 

## 2014-09-21 NOTE — Progress Notes (Signed)
Pre visit review using our clinic review tool, if applicable. No additional management support is needed unless otherwise documented below in the visit note. 

## 2014-09-21 NOTE — Progress Notes (Signed)
   Subjective:    Patient ID: Barbara Ortega, female    DOB: [birth year], 39 y.o.   MRN: [id number]  HPI  39YO female presents for follow up.  Migraine - Took Imitrex for headache on one occasion with resolution of symptoms.  HTN - Feeling well. Taking HCTZ daily.  Planning to have IUD placed next week.  Past medical, surgical, family and social history per today's encounter.  Review of Systems  Constitutional: Negative for fever, chills, appetite change, fatigue and unexpected weight change.  Eyes: Negative for visual disturbance.  Respiratory: Negative for shortness of breath.   Cardiovascular: Negative for chest pain, palpitations and leg swelling.  Gastrointestinal: Negative for abdominal pain.  Skin: Negative for color change and rash.  Neurological: Positive for headaches. Negative for dizziness, seizures, facial asymmetry, weakness, light-headedness and numbness.  Hematological: Negative for adenopathy. Does not bruise/bleed easily.  Psychiatric/Behavioral: Negative for dysphoric mood. The patient is not nervous/anxious.        Objective:    BP 132/88 mmHg  Pulse 85  Temp(Src) 98.3 F (36.8 C) (Oral)  Ht 5\' 3"  (1.6 m)  Wt 173 lb (78.472 kg)  BMI 30.65 kg/m2  SpO2 100%  LMP 08/01/2014 Physical Exam  Constitutional: She is oriented to person, place, and time. She appears well-developed and well-nourished. No distress.  HENT:  Head: Normocephalic and atraumatic.  Right Ear: External ear normal.  Left Ear: External ear normal.  Nose: Nose normal.  Mouth/Throat: Oropharynx is clear and moist. No oropharyngeal exudate.  Eyes: Conjunctivae are normal. Pupils are equal, round, and reactive to light. Right eye exhibits no discharge. Left eye exhibits no discharge. No scleral icterus.  Neck: Normal range of motion. Neck supple. No tracheal deviation present. No thyromegaly present.  Cardiovascular: Normal rate, regular rhythm, normal heart sounds and intact distal pulses.   Exam reveals no gallop and no friction rub.   No murmur heard. Pulmonary/Chest: Effort normal and breath sounds normal. No respiratory distress. She has no wheezes. She has no rales. She exhibits no tenderness.  Musculoskeletal: Normal range of motion. She exhibits no edema or tenderness.  Lymphadenopathy:    She has no cervical adenopathy.  Neurological: She is alert and oriented to person, place, and time. No cranial nerve deficit. She exhibits normal muscle tone. Coordination normal.  Skin: Skin is warm and dry. No rash noted. She is not diaphoretic. No erythema. No pallor.  Psychiatric: She has a normal mood and affect. Her behavior is normal. Judgment and thought content normal.          Assessment & Plan:   Problem List Items Addressed This Visit      Unprioritized   Contraceptive management    Scheduled to get IUD.      Essential hypertension, benign    BP Readings from Last 3 Encounters:  09/21/14 132/88  08/22/14 146/89  04/27/13 130/82   BP improved on HCTZ. Will continue.      Migraine with aura - Primary    Migraines are well controlled with Imitrex. Will continue. Follow up prn.          Return in about 6 months (around 03/23/2015) for Recheck.

## 2014-09-21 NOTE — Assessment & Plan Note (Signed)
BP Readings from Last 3 Encounters:  09/21/14 132/88  08/22/14 146/89  04/27/13 130/82   BP improved on HCTZ. Will continue.

## 2014-10-01 LAB — HM PAP SMEAR

## 2014-10-13 ENCOUNTER — Encounter: Payer: Self-pay | Admitting: Internal Medicine

## 2015-01-18 ENCOUNTER — Other Ambulatory Visit: Payer: Self-pay | Admitting: Obstetrics and Gynecology

## 2015-01-19 LAB — CYTOLOGY - PAP

## 2015-01-31 ENCOUNTER — Other Ambulatory Visit: Payer: Self-pay

## 2015-01-31 DIAGNOSIS — Z1231 Encounter for screening mammogram for malignant neoplasm of breast: Secondary | ICD-10-CM

## 2015-03-03 LAB — HM MAMMOGRAPHY

## 2015-03-06 ENCOUNTER — Ambulatory Visit
Admission: RE | Admit: 2015-03-06 | Discharge: 2015-03-06 | Disposition: A | Discharge status: Home / Self Care | Payer: Managed Care, Other (non HMO) | Source: Ambulatory Visit

## 2015-03-06 DIAGNOSIS — Z1231 Encounter for screening mammogram for malignant neoplasm of breast: Secondary | ICD-10-CM

## 2015-03-22 ENCOUNTER — Telehealth: Payer: Self-pay | Admitting: Internal Medicine

## 2015-03-22 NOTE — Telephone Encounter (Signed)
Hey can you check with Dr Gilford Rile if she wants to see pt to check her BP or can pt get BP checked with nurse? Thank You!

## 2015-03-22 NOTE — Telephone Encounter (Signed)
Ok. Thank You! °

## 2015-03-22 NOTE — Telephone Encounter (Signed)
Patient needs 30 min appointment.

## 2015-03-23 ENCOUNTER — Ambulatory Visit: Payer: Managed Care, Other (non HMO) | Admitting: Internal Medicine

## 2015-03-23 ENCOUNTER — Ambulatory Visit: Payer: Self-pay | Admitting: Internal Medicine

## 2015-04-27 ENCOUNTER — Encounter: Payer: Self-pay | Admitting: Internal Medicine

## 2015-04-27 ENCOUNTER — Ambulatory Visit (INDEPENDENT_AMBULATORY_CARE_PROVIDER_SITE_OTHER): Payer: Managed Care, Other (non HMO) | Admitting: Internal Medicine

## 2015-04-27 VITALS — BP 118/82 | HR 75 | Temp 98.2°F | Resp 18 | Ht 63.0 in | Wt 177.5 lb

## 2015-04-27 DIAGNOSIS — Z309 Encounter for contraceptive management, unspecified: Secondary | ICD-10-CM | POA: Diagnosis not present

## 2015-04-27 DIAGNOSIS — I10 Essential (primary) hypertension: Secondary | ICD-10-CM | POA: Diagnosis not present

## 2015-04-27 MED ORDER — SUMATRIPTAN SUCCINATE 50 MG PO TABS: 50.0000 mg | ORAL_TABLET | ORAL | Status: DC | PRN

## 2015-04-27 NOTE — Assessment & Plan Note (Signed)
Reviewed recent PAP and Mirena placement.

## 2015-04-27 NOTE — Progress Notes (Signed)
Pre visit review using our clinic review tool, if applicable. No additional management support is needed unless otherwise documented below in the visit note. 

## 2015-04-27 NOTE — Patient Instructions (Signed)
Fasting labs prior to physical in 07/2015.

## 2015-04-27 NOTE — Assessment & Plan Note (Signed)
BP Readings from Last 3 Encounters:  04/27/15 118/82  09/21/14 132/88  08/22/14 146/89   BP well controlled. Renal function with labs in 07/2015. Continue HCTZ.

## 2015-04-27 NOTE — Progress Notes (Signed)
Subjective:    Patient ID: Barbara Ortega, female    DOB: 1976/04/19, 39 y.o.   MRN: [id number]  HPI  39YO female presents for follow up.  Feeling well. No concerns today. Compliant with medication. No CP. No recent headaches. Had IUD placed. Some cramping and spotting for 1-2 weeks, but this has now resolved.  Wt Readings from Last 3 Encounters:  04/27/15 177 lb 8 oz (80.513 kg)  09/21/14 173 lb (78.472 kg)  08/22/14 174 lb 8 oz (79.153 kg)   BP Readings from Last 3 Encounters:  04/27/15 118/82  09/21/14 132/88  08/22/14 146/89    Past Medical History  Diagnosis Date  . Sinus disease    Family History  Problem Relation Age of Onset  . Hypertension Mother   . Cancer Father     lung   Past Surgical History  Procedure Laterality Date  . Nasal sinus surgery  2011    Dr. Kathyrn Sheriff  . Wisdom tooth extraction    . Vaginal delivery      1  . Breast biopsy      left benign   Social History   Social History  . Marital Status: Married    Spouse Name: N/A  . Number of Children: N/A  . Years of Education: N/A   Social History Main Topics  . Smoking status: Never Smoker   . Smokeless tobacco: Never Used  . Alcohol Use: No  . Drug Use: No  . Sexual Activity: Not Asked   Other Topics Concern  . None   Social History Narrative   Lives with son in [place].      Work - Software engineer, Research scientist (life sciences)      Diet - regular      Exercise- occasional    Review of Systems  Constitutional: Negative for fever, chills, appetite change, fatigue and unexpected weight change.  Eyes: Negative for visual disturbance.  Respiratory: Negative for cough, chest tightness, shortness of breath and wheezing.   Cardiovascular: Negative for chest pain and leg swelling.  Gastrointestinal: Negative for abdominal pain, diarrhea and constipation.  Genitourinary: Negative for menstrual problem.  Skin: Negative for color change and rash.  Neurological: Negative for headaches.    Hematological: Negative for adenopathy. Does not bruise/bleed easily.  Psychiatric/Behavioral: Negative for dysphoric mood. The patient is not nervous/anxious.        Objective:    BP 118/82 mmHg  Pulse 75  Temp(Src) 98.2 F (36.8 C) (Oral)  Resp 18  Ht 5\' 3"  (1.6 m)  Wt 177 lb 8 oz (80.513 kg)  BMI 31.45 kg/m2  SpO2 97% Physical Exam  Constitutional: She is oriented to person, place, and time. She appears well-developed and well-nourished. No distress.  HENT:  Head: Normocephalic and atraumatic.  Right Ear: External ear normal.  Left Ear: External ear normal.  Nose: Nose normal.  Mouth/Throat: Oropharynx is clear and moist. No oropharyngeal exudate.  Eyes: Conjunctivae are normal. Pupils are equal, round, and reactive to light. Right eye exhibits no discharge. Left eye exhibits no discharge. No scleral icterus.  Neck: Normal range of motion. Neck supple. No tracheal deviation present. No thyromegaly present.  Cardiovascular: Normal rate, regular rhythm, normal heart sounds and intact distal pulses.  Exam reveals no gallop and no friction rub.   No murmur heard. Pulmonary/Chest: Effort normal and breath sounds normal. No respiratory distress. She has no wheezes. She has no rales. She exhibits no tenderness.  Musculoskeletal: Normal range of motion. She exhibits no  edema or tenderness.  Lymphadenopathy:    She has no cervical adenopathy.  Neurological: She is alert and oriented to person, place, and time. No cranial nerve deficit. She exhibits normal muscle tone. Coordination normal.  Skin: Skin is warm and dry. No rash noted. She is not diaphoretic. No erythema. No pallor.  Psychiatric: She has a normal mood and affect. Her behavior is normal. Judgment and thought content normal.          Assessment & Plan:   Problem List Items Addressed This Visit      Unprioritized   Contraceptive management    Reviewed recent PAP and Mirena placement.       Essential  hypertension, benign - Primary    BP Readings from Last 3 Encounters:  04/27/15 118/82  09/21/14 132/88  08/22/14 146/89   BP well controlled. Renal function with labs in 07/2015. Continue HCTZ.          Return in about 3 months (around 07/26/2015) for Physical.

## 2015-06-14 ENCOUNTER — Encounter: Payer: Self-pay | Admitting: Family Medicine

## 2015-06-14 ENCOUNTER — Ambulatory Visit (INDEPENDENT_AMBULATORY_CARE_PROVIDER_SITE_OTHER): Payer: Managed Care, Other (non HMO) | Admitting: Family Medicine

## 2015-06-14 VITALS — BP 124/82 | HR 104 | Temp 98.4°F | Ht 63.0 in | Wt 179.5 lb

## 2015-06-14 DIAGNOSIS — N390 Urinary tract infection, site not specified: Secondary | ICD-10-CM

## 2015-06-14 LAB — POCT URINALYSIS DIPSTICK
BILIRUBIN UA: NEGATIVE
Glucose, UA: NEGATIVE
KETONES UA: NEGATIVE
NITRITE UA: NEGATIVE
Protein, UA: NEGATIVE
Spec Grav, UA: 1.025
Urobilinogen, UA: 0.2
pH, UA: 5.5

## 2015-06-14 MED ORDER — CEPHALEXIN 500 MG PO CAPS: 500.0000 mg | ORAL_CAPSULE | Freq: Four times a day (QID) | ORAL | Status: DC

## 2015-06-14 NOTE — Patient Instructions (Signed)
Take the medication as prescribed.  Follow up if you fail to improve or worsen.  We will call with the culture results.  Take care  Dr. Lacinda Axon

## 2015-06-14 NOTE — Assessment & Plan Note (Signed)
History and urinalysis consistent with UTI. Sending for culture. Treating empirically with Keflex while awaiting culture.

## 2015-06-14 NOTE — Progress Notes (Signed)
Pre visit review using our clinic review tool, if applicable. No additional management support is needed unless otherwise documented below in the visit note. 

## 2015-06-14 NOTE — Progress Notes (Signed)
Subjective:  Patient ID: Barbara Ortega, female    DOB: 1976/02/01  Age: 40 y.o. MRN: [id number]  CC: ? UTI  HPI:  40 year old female presents to clinic today with concern for UTI.  Patient reports that earlier today she developed urinary frequency and urgency. She denies any dysuria. She states she's also had some low back pain/cramping. She reports that she is felt feverish but has not had a documented fever. She's also felt fatigued. She also reports chills. No exacerbating or relieving factors. She's concerned that she may have a UTI.  Social Hx   Social History   Social History  . Marital Status: Married    Spouse Name: N/A  . Number of Children: N/A  . Years of Education: N/A   Social History Main Topics  . Smoking status: Never Smoker   . Smokeless tobacco: Never Used  . Alcohol Use: No  . Drug Use: No  . Sexual Activity: Not Asked   Other Topics Concern  . None   Social History Narrative   Lives with son in [place].      Work - Software engineer, Research scientist (life sciences)      Diet - regular      Exercise- occasional    Review of Systems  Constitutional: Positive for chills.  Genitourinary: Positive for urgency and frequency.  Musculoskeletal: Positive for back pain.   Objective:  BP 124/82 mmHg  Pulse 104  Temp(Src) 98.4 F (36.9 C) (Oral)  Ht 5\' 3"  (1.6 m)  Wt 179 lb 8 oz (81.421 kg)  BMI 31.81 kg/m2  SpO2 98%  BP/Weight 06/14/2015 04/27/2015 [id number]  Systolic BP [id number]  Diastolic BP 82 82 88  Wt. (Lbs) 179.5 177.5 173  BMI 31.81 31.45 30.65   Physical Exam  Constitutional: She appears well-developed. No distress.  Cardiovascular: Regular rhythm.  Tachycardia present.   Pulmonary/Chest: Effort normal and breath sounds normal.  Abdominal: Soft. She exhibits no distension.  Mild suprapubic tenderness. No CVA tenderness.   Neurological: She is alert.  Vitals reviewed.   Lab Results  Component Value Date   WBC 6.7 08/05/2012   HGB 13.2  08/05/2012   HCT 39.2 08/05/2012   PLT 347.0 08/05/2012   GLUCOSE 87 08/22/2014   CHOL 185 08/05/2012   TRIG 177.0* 08/05/2012   HDL 36.30* 08/05/2012   LDLCALC 113* 08/05/2012   ALT 29 08/22/2014   AST 20 08/22/2014   NA 136 08/22/2014   K 3.8 08/22/2014   CL 105 08/22/2014   CREATININE 0.98 08/22/2014   BUN 12 08/22/2014   CO2 26 08/22/2014   TSH 0.98 08/05/2012   MICROALBUR 1.6 04/12/2013   Results for orders placed or performed in visit on 06/14/15 (from the past 24 hour(s))  POCT Urinalysis Dipstick     Status: Abnormal   Collection Time: 06/14/15  2:56 PM  Result Value Ref Range   Color, UA yellow    Clarity, UA clear    Glucose, UA neg    Bilirubin, UA neg    Ketones, UA neg    Spec Grav, UA 1.025    Blood, UA large    pH, UA 5.5    Protein, UA neg    Urobilinogen, UA 0.2    Nitrite, UA neg    Leukocytes, UA small (1+) (A) Negative    Assessment & Plan:   Problem List Items Addressed This Visit    UTI (lower urinary tract infection) - Primary    History and urinalysis  consistent with UTI. Sending for culture. Treating empirically with Keflex while awaiting culture.      Relevant Medications   cephALEXin (KEFLEX) 500 MG capsule   Other Relevant Orders   POCT Urinalysis Dipstick (Completed)   Urine Culture      Meds ordered this encounter  Medications  . cephALEXin (KEFLEX) 500 MG capsule    Sig: Take 1 capsule (500 mg total) by mouth 4 (four) times daily.    Dispense:  14 capsule    Refill:  0    Follow-up: PRN  [place]

## 2015-06-16 LAB — URINE CULTURE
Colony Count: NO GROWTH
ORGANISM ID, BACTERIA: NO GROWTH

## 2015-07-19 ENCOUNTER — Other Ambulatory Visit (INDEPENDENT_AMBULATORY_CARE_PROVIDER_SITE_OTHER): Payer: Managed Care, Other (non HMO)

## 2015-07-19 ENCOUNTER — Other Ambulatory Visit: Payer: Self-pay | Admitting: Internal Medicine

## 2015-07-19 DIAGNOSIS — Z0000 Encounter for general adult medical examination without abnormal findings: Secondary | ICD-10-CM

## 2015-07-19 DIAGNOSIS — R7989 Other specified abnormal findings of blood chemistry: Secondary | ICD-10-CM | POA: Diagnosis not present

## 2015-07-19 LAB — CBC WITH DIFFERENTIAL/PLATELET
BASOS ABS: 0 10*3/uL (ref 0.0–0.1)
Basophils Relative: 0.4 % (ref 0.0–3.0)
EOS ABS: 0.5 10*3/uL (ref 0.0–0.7)
Eosinophils Relative: 4.8 % (ref 0.0–5.0)
HEMATOCRIT: 42.7 % (ref 36.0–46.0)
Hemoglobin: 14.4 g/dL (ref 12.0–15.0)
LYMPHS PCT: 31.6 % (ref 12.0–46.0)
Lymphs Abs: 3 10*3/uL (ref 0.7–4.0)
MCHC: 33.7 g/dL (ref 30.0–36.0)
MCV: 81.2 fl (ref 78.0–100.0)
MONO ABS: 0.6 10*3/uL (ref 0.1–1.0)
Monocytes Relative: 5.9 % (ref 3.0–12.0)
NEUTROS ABS: 5.4 10*3/uL (ref 1.4–7.7)
NEUTROS PCT: 57.3 % (ref 43.0–77.0)
PLATELETS: 320 10*3/uL (ref 150.0–400.0)
RBC: 5.26 Mil/uL — ABNORMAL HIGH (ref 3.87–5.11)
RDW: 13.6 % (ref 11.5–15.5)
WBC: 9.5 10*3/uL (ref 4.0–10.5)

## 2015-07-19 LAB — COMPREHENSIVE METABOLIC PANEL
ALT: 30 U/L (ref 0–35)
AST: 20 U/L (ref 0–37)
Albumin: 4.4 g/dL (ref 3.5–5.2)
Alkaline Phosphatase: 57 U/L (ref 39–117)
BILIRUBIN TOTAL: 0.7 mg/dL (ref 0.2–1.2)
BUN: 12 mg/dL (ref 6–23)
CALCIUM: 9.6 mg/dL (ref 8.4–10.5)
CO2: 29 meq/L (ref 19–32)
CREATININE: 0.9 mg/dL (ref 0.40–1.20)
Chloride: 102 mEq/L (ref 96–112)
GFR: 73.54 mL/min (ref >60.00)
GLUCOSE: 90 mg/dL (ref 70–99)
Potassium: 4.2 mEq/L (ref 3.5–5.1)
SODIUM: 137 meq/L (ref 135–145)
Total Protein: 7.1 g/dL (ref 6.0–8.3)

## 2015-07-19 LAB — LIPID PANEL
CHOL/HDL RATIO: 6
Cholesterol: 192 mg/dL (ref 0–200)
HDL: 31.9 mg/dL — ABNORMAL LOW (ref >39.00)
NONHDL: 160.07
Triglycerides: 207 mg/dL — ABNORMAL HIGH (ref 0.0–149.0)
VLDL: 41.4 mg/dL — ABNORMAL HIGH (ref 0.0–40.0)

## 2015-07-19 LAB — VITAMIN D 25 HYDROXY (VIT D DEFICIENCY, FRACTURES): VITD: 19.67 ng/mL — AB (ref 30.00–100.00)

## 2015-07-19 LAB — TSH: TSH: 1.34 u[IU]/mL (ref 0.35–4.50)

## 2015-07-19 LAB — LDL CHOLESTEROL, DIRECT: LDL DIRECT: 119 mg/dL

## 2015-07-26 ENCOUNTER — Encounter: Payer: Managed Care, Other (non HMO) | Admitting: Internal Medicine

## 2015-07-30 ENCOUNTER — Ambulatory Visit
Admission: EM | Admit: 2015-07-30 | Discharge: 2015-07-30 | Disposition: A | Discharge status: Home / Self Care | Payer: Managed Care, Other (non HMO) | Attending: Family Medicine | Admitting: Family Medicine

## 2015-07-30 DIAGNOSIS — J101 Influenza due to other identified influenza virus with other respiratory manifestations: Secondary | ICD-10-CM

## 2015-07-30 LAB — RAPID INFLUENZA A&B ANTIGENS: Influenza A (ARMC): NEGATIVE

## 2015-07-30 LAB — RAPID INFLUENZA A&B ANTIGENS (ARMC ONLY): INFLUENZA B (ARMC): POSITIVE — AB

## 2015-07-30 MED ORDER — HYDROCOD POLST-CPM POLST ER 10-8 MG/5ML PO SUER: 5.0000 mL | Freq: Two times a day (BID) | ORAL | Status: DC

## 2015-07-30 MED ORDER — FLUTICASONE PROPIONATE 50 MCG/ACT NA SUSP: 2.0000 | Freq: Every day | NASAL | Status: DC

## 2015-07-30 MED ORDER — OSELTAMIVIR PHOSPHATE 75 MG PO CAPS: 75.0000 mg | ORAL_CAPSULE | Freq: Two times a day (BID) | ORAL | Status: DC

## 2015-07-30 NOTE — ED Provider Notes (Signed)
CSN: [id number]     Arrival date & time 07/30/15  0810 History   First MD Initiated Contact with Patient 07/30/15 [phone]     Chief Complaint  Patient presents with  . Cough   (Consider location/radiation/quality/duration/timing/severity/associated sxs/prior Treatment) HPI  40 year old female who presents with a cough headache sore throat body aches and fevers that began on Friday 2 days ago worsened last night. Her son was positive for the flu last week and had been started on Tamiflu. He is improved now. 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 At present she is afebrile 98.6 pulse 96 respirations 17 and blood pressure 06/17/1980 and O2 sats on room air percent. Her main complaint is that of the cough and body aches.  Past Medical History  Diagnosis Date  . Sinus disease    Past Surgical History  Procedure Laterality Date  . Nasal sinus surgery  2011    Dr. Kathyrn Sheriff  . Wisdom tooth extraction    . Vaginal delivery      1  . Breast biopsy      left benign   Family History  Problem Relation Age of Onset  . Hypertension Mother   . Cancer Father     lung   Social History  Substance Use Topics  . Smoking status: Never Smoker   . Smokeless tobacco: Never Used  . Alcohol Use: No   OB History    No data available     Review of Systems  Constitutional: Positive for fever, chills, activity change and fatigue.  HENT: Positive for congestion, postnasal drip, rhinorrhea, sinus pressure, sneezing, sore throat and voice change.   Respiratory: Positive for cough.   All other systems reviewed and are negative.   Allergies  Sulfur  Home Medications   Prior to Admission medications   Medication Sig Start Date End Date Taking? Authorizing Provider  Azelastine-Fluticasone 137-50 MCG/ACT SUSP Place 1 spray into the nose daily. 08/05/12  Yes Jackolyn Confer, MD  cetirizine (ZYRTEC) 10 MG tablet Take 10 mg by mouth daily.   Yes  Historical Provider, MD  hydrochlorothiazide (MICROZIDE) 12.5 MG capsule Take 1 capsule (12.5 mg total) by mouth daily. 08/22/14  Yes Jackolyn Confer, MD  SUMAtriptan (IMITREX) 50 MG tablet Take 1 tablet (50 mg total) by mouth every 2 (two) hours as needed for migraine. May repeat in 2 hours if headache persists or recurs. 04/27/15  Yes Jackolyn Confer, MD  cephALEXin (KEFLEX) 500 MG capsule Take 1 capsule (500 mg total) by mouth 4 (four) times daily. 06/14/15   Coral Spikes, DO  chlorpheniramine-HYDROcodone (TUSSIONEX PENNKINETIC ER) 10-8 MG/5ML SUER Take 5 mLs by mouth 2 (two) times daily. 07/30/15   Lorin Picket, PA-C  fluticasone (FLONASE) 50 MCG/ACT nasal spray Place 2 sprays into both nostrils daily. 07/30/15   Lorin Picket, PA-C  oseltamivir (TAMIFLU) 75 MG capsule Take 1 capsule (75 mg total) by mouth every 12 (twelve) hours. 07/30/15   Lorin Picket, PA-C   Meds Ordered and Administered this Visit  Medications - No data to display  BP 122/82 mmHg  Pulse 96  Temp(Src) 98.6 F (37 C) (Tympanic)  Resp 17  Ht 5\' 3"  (1.6 m)  Wt 174 lb (78.926 kg)  BMI 30.83 kg/m2  SpO2 100% No data found.   Physical Exam  Constitutional: She is oriented to person, place, and time. She appears well-developed and well-nourished.  No distress.  HENT:  Head: Normocephalic and atraumatic.  Right Ear: External ear normal.  Left Ear: External ear normal.  Nose: Nose normal.  Mouth/Throat: Oropharynx is clear and moist. No oropharyngeal exudate.  Eyes: Conjunctivae are normal. Pupils are equal, round, and reactive to light.  Neck: Normal range of motion. Neck supple.  Pulmonary/Chest: Effort normal and breath sounds normal. No respiratory distress. She has no wheezes. She has no rales.  Musculoskeletal: Normal range of motion. She exhibits no edema or tenderness.  Lymphadenopathy:    She has no cervical adenopathy.  Neurological: She is alert and oriented to person, place, and time.  Skin: Skin  is warm and dry. She is not diaphoretic.  Psychiatric: She has a normal mood and affect. Her behavior is normal. Judgment and thought content normal.  Nursing note and vitals reviewed.   ED Course  Procedures (including critical care time)  Labs Review Labs Reviewed  RAPID INFLUENZA A&B ANTIGENS ([place]) - Abnormal; Notable for the following:    Influenza B ([facility]) POSITIVE (*)    All other components within normal limits    Imaging Review No results found.   Visual Acuity Review  Right Eye Distance:   Left Eye Distance:   Bilateral Distance:    Right Eye Near:   Left Eye Near:    Bilateral Near:         MDM   1. Influenza B    Discharge Medication List as of 07/30/2015  9:02 AM    START taking these medications   Details  chlorpheniramine-HYDROcodone (TUSSIONEX PENNKINETIC ER) 10-8 MG/5ML SUER Take 5 mLs by mouth 2 (two) times daily., Starting 07/30/2015, Until Discontinued, Print    fluticasone (FLONASE) 50 MCG/ACT nasal spray Place 2 sprays into both nostrils daily., Starting 07/30/2015, Until Discontinued, Print    oseltamivir (TAMIFLU) 75 MG capsule Take 1 capsule (75 mg total) by mouth every 12 (twelve) hours., Starting 07/30/2015, Until Discontinued, Print      Plan: 1. Test/x-ray results and diagnosis reviewed with patient 2. rx as per orders; risks, benefits, potential side effects reviewed with patient 3. Recommend supportive treatment with rest and fluids. Given a note for work to remain out of work until her fever breaks probably by Wednesday. If she is not improving she is to follow-up with her primary care. 4. F/u prn if symptoms worsen or don't improve     Lorin Picket, PA-C 07/30/15 [phone]

## 2015-07-30 NOTE — ED Notes (Signed)
Patient states that cough, headache, sore throat, body aches, fevers. Patient states that cough started on Friday and then she started having other symptoms Saturday night. Patient reports that her son was positive for the flu last week.

## 2015-07-30 NOTE — Discharge Instructions (Signed)
Influenza, Adult °Influenza ("the flu") is a viral infection of the respiratory tract. It occurs more often in winter months because people spend more time in close contact with one another. Influenza can make you feel very sick. Influenza easily spreads from person to person (contagious). °CAUSES  °Influenza is caused by a virus that infects the respiratory tract. You can catch the virus by breathing in droplets from an infected person's cough or sneeze. You can also catch the virus by touching something that was recently contaminated with the virus and then touching your mouth, nose, or eyes. °RISKS AND COMPLICATIONS °You may be at risk for a more severe case of influenza if you smoke cigarettes, have diabetes, have chronic heart disease (such as heart failure) or lung disease (such as asthma), or if you have a weakened immune system. Elderly people and pregnant women are also at risk for more serious infections. The most common problem of influenza is a lung infection (pneumonia). Sometimes, this problem can require emergency medical care and may be life threatening. °SIGNS AND SYMPTOMS  °Symptoms typically last 4 to 10 days and may include: °· Fever. °· Chills. °· Headache, body aches, and muscle aches. °· Sore throat. °· Chest discomfort and cough. °· Poor appetite. °· Weakness or feeling tired. °· Dizziness. °· Nausea or vomiting. °DIAGNOSIS  °Diagnosis of influenza is often made based on your history and a physical exam. A nose or throat swab test can be done to confirm the diagnosis. °TREATMENT  °In mild cases, influenza goes away on its own. Treatment is directed at relieving symptoms. For more severe cases, your health care provider may prescribe antiviral medicines to shorten the sickness. Antibiotic medicines are not effective because the infection is caused by a virus, not by bacteria. °HOME CARE INSTRUCTIONS °· Take medicines only as directed by your health care provider. °· Use a cool mist humidifier  to make breathing easier. °· Get plenty of rest until your temperature returns to normal. This usually takes 3 to 4 days. °· Drink enough fluid to keep your urine clear or pale yellow. °· Cover your mouth and nose when coughing or sneezing, and wash your hands well to prevent the virus from spreading. °· Stay home from work or school until the fever is gone for at least 1 full day. °PREVENTION  °An annual influenza vaccination (flu shot) is the best way to avoid getting influenza. An annual flu shot is now routinely recommended for all adults in the [place] °SEEK MEDICAL CARE IF: °· You experience chest pain, your cough worsens, or you produce more mucus. °· You have nausea, vomiting, or diarrhea. °· Your fever returns or gets worse. °SEEK IMMEDIATE MEDICAL CARE IF: °· You have trouble breathing, you become short of breath, or your skin or nails become bluish. °· You have severe pain or stiffness in the neck. °· You develop a sudden headache, or pain in the face or ear. °· You have nausea or vomiting that you cannot control. °MAKE SURE YOU:  °· Understand these instructions. °· Will watch your condition. °· Will get help right away if you are not doing well or get worse. °  °This information is not intended to replace advice given to you by your health care provider. Make sure you discuss any questions you have with your health care provider. °  °Document Released: 05/10/2000 Document Revised: 06/03/2014 Document Reviewed: 08/12/2011 °Elsevier Interactive Patient Education ©2016 [employer] ° °Cool Mist Vaporizers °Vaporizers may help relieve the symptoms of a cough and cold. They add moisture to the air, which helps mucus to become thinner and less sticky. This makes it easier to   breathe and cough up secretions. Cool mist vaporizers do not cause serious burns like hot mist vaporizers, which may also be called steamers or humidifiers. Vaporizers have not been proven to help with colds. You should not use a vaporizer  if you are allergic to mold. °HOME CARE INSTRUCTIONS °· Follow the package instructions for the vaporizer. °· Do not use anything other than distilled water in the vaporizer. °· Do not run the vaporizer all of the time. This can cause mold or bacteria to grow in the vaporizer. °· Clean the vaporizer after each time it is used. °· Clean and dry the vaporizer well before storing it. °· Stop using the vaporizer if worsening respiratory symptoms develop. °  °This information is not intended to replace advice given to you by your health care provider. Make sure you discuss any questions you have with your health care provider. °  °Document Released: 02/08/2004 Document Revised: 05/18/2013 Document Reviewed: 09/30/2012 °Elsevier Interactive Patient Education ©2016 Else[employer] Inc[employer] ° °

## 2015-08-08 ENCOUNTER — Encounter: Payer: Self-pay | Admitting: Internal Medicine

## 2015-08-11 ENCOUNTER — Ambulatory Visit: Payer: Managed Care, Other (non HMO) | Admitting: Internal Medicine

## 2015-09-01 ENCOUNTER — Encounter: Payer: Self-pay | Admitting: Internal Medicine

## 2015-09-01 ENCOUNTER — Ambulatory Visit (INDEPENDENT_AMBULATORY_CARE_PROVIDER_SITE_OTHER): Payer: Managed Care, Other (non HMO) | Admitting: Internal Medicine

## 2015-09-01 VITALS — BP 136/89 | HR 71 | Temp 98.0°F | Ht 62.75 in | Wt 182.1 lb

## 2015-09-01 DIAGNOSIS — I10 Essential (primary) hypertension: Secondary | ICD-10-CM

## 2015-09-01 DIAGNOSIS — Z0000 Encounter for general adult medical examination without abnormal findings: Secondary | ICD-10-CM

## 2015-09-01 MED ORDER — HYDROCHLOROTHIAZIDE 12.5 MG PO CAPS: 12.5000 mg | ORAL_CAPSULE | Freq: Every day | ORAL | Status: DC

## 2015-09-01 NOTE — Assessment & Plan Note (Signed)
General medical exam normal today. Breast and pelvic exam deferred as completed by her OB/GYN in 09/2014. PAP reviewed. Mammogram reviewed, and due in 10/207. Labs reviewed. Encouraged healthy diet and exercise.  Immunizations are UTD.

## 2015-09-01 NOTE — Progress Notes (Signed)
Subjective:    Patient ID: Barbara Ortega, female    DOB: Sep 16, 1975, 40 y.o.   MRN: [id number]  HPI  40YO female presents for physical exam.  Recently treated for Flu. Symptoms have improved. No concerns today. Feeling well.  Wt Readings from Last 3 Encounters:  09/01/15 182 lb 2 oz (82.611 kg)  07/30/15 174 lb (78.926 kg)  06/14/15 179 lb 8 oz (81.421 kg)   BP Readings from Last 3 Encounters:  09/01/15 136/89  07/30/15 122/82  06/14/15 124/82    Past Medical History  Diagnosis Date  . Sinus disease    Family History  Problem Relation Age of Onset  . Hypertension Mother   . Cancer Father     lung   Past Surgical History  Procedure Laterality Date  . Nasal sinus surgery  2011    Dr. Kathyrn Sheriff  . Wisdom tooth extraction    . Vaginal delivery      1  . Breast biopsy      left benign   Social History   Social History  . Marital Status: Married    Spouse Name: N/A  . Number of Children: N/A  . Years of Education: N/A   Social History Main Topics  . Smoking status: Never Smoker   . Smokeless tobacco: Never Used  . Alcohol Use: No  . Drug Use: No  . Sexual Activity: Not Asked   Other Topics Concern  . None   Social History Narrative   Lives with son in [place].      Work - Software engineer, Research scientist (life sciences)      Diet - regular      Exercise- occasional    Review of Systems  Constitutional: Negative for fever, chills, appetite change, fatigue and unexpected weight change.  HENT: Negative for congestion, rhinorrhea, sore throat, trouble swallowing and voice change.   Eyes: Negative for visual disturbance.  Respiratory: Negative for shortness of breath.   Cardiovascular: Negative for chest pain and leg swelling.  Gastrointestinal: Negative for nausea, vomiting, abdominal pain, diarrhea and constipation.  Musculoskeletal: Negative for myalgias and arthralgias.  Skin: Negative for color change and rash.  Neurological: Negative for weakness.    Hematological: Negative for adenopathy. Does not bruise/bleed easily.  Psychiatric/Behavioral: Negative for sleep disturbance and dysphoric mood. The patient is not nervous/anxious.        Objective:    BP 136/89 mmHg  Pulse 71  Temp(Src) 98 F (36.7 C) (Oral)  Ht 5' 2.75" (1.594 m)  Wt 182 lb 2 oz (82.611 kg)  BMI 32.51 kg/m2  SpO2 97% Physical Exam  Constitutional: She is oriented to person, place, and time. She appears well-developed and well-nourished. No distress.  HENT:  Head: Normocephalic and atraumatic.  Right Ear: External ear normal.  Left Ear: External ear normal.  Nose: Nose normal.  Mouth/Throat: Oropharynx is clear and moist. No oropharyngeal exudate.  Eyes: Conjunctivae and EOM are normal. Pupils are equal, round, and reactive to light. Right eye exhibits no discharge.  Neck: Normal range of motion. Neck supple. No thyromegaly present.  Cardiovascular: Normal rate, regular rhythm, normal heart sounds and intact distal pulses.  Exam reveals no gallop and no friction rub.   No murmur heard. Pulmonary/Chest: Effort normal. No respiratory distress. She has no wheezes. She has no rales.  Abdominal: Soft. Bowel sounds are normal. She exhibits no distension and no mass. There is no tenderness. There is no rebound and no guarding.  Musculoskeletal: Normal range of motion.  She exhibits no edema or tenderness.  Lymphadenopathy:    She has no cervical adenopathy.  Neurological: She is alert and oriented to person, place, and time. No cranial nerve deficit. Coordination normal.  Skin: Skin is warm and dry. No rash noted. She is not diaphoretic. No erythema. No pallor.  Psychiatric: She has a normal mood and affect. Her behavior is normal. Judgment and thought content normal.          Assessment & Plan:   Problem List Items Addressed This Visit      Unprioritized   Essential hypertension, benign   Relevant Medications   hydrochlorothiazide (MICROZIDE) 12.5 MG  capsule   Routine general medical examination at a health care facility - Primary    General medical exam normal today. Breast and pelvic exam deferred as completed by her OB/GYN in 09/2014. PAP reviewed. Mammogram reviewed, and due in 10/207. Labs reviewed. Encouraged healthy diet and exercise.  Immunizations are UTD.          Return in about 6 months (around 03/02/2016) for Recheck.  Ronette Deter, MD [facility]

## 2015-09-01 NOTE — Progress Notes (Signed)
Pre visit review using our clinic review tool, if applicable. No additional management support is needed unless otherwise documented below in the visit note. 

## 2015-09-01 NOTE — Patient Instructions (Signed)
Health Maintenance, Female Adopting a healthy lifestyle and getting preventive care can go a long way to promote health and wellness. Talk with your health care provider about what schedule of regular examinations is right for you. This is a good chance for you to check in with your provider about disease prevention and staying healthy. In between checkups, there are plenty of things you can do on your own. Experts have done a lot of research about which lifestyle changes and preventive measures are most likely to keep you healthy. Ask your health care provider for more information. WEIGHT AND DIET  Eat a healthy diet  Be sure to include plenty of vegetables, fruits, low-fat dairy products, and lean protein.  Do not eat a lot of foods high in solid fats, added sugars, or salt.  Get regular exercise. This is one of the most important things you can do for your health.  Most adults should exercise for at least 150 minutes each week. The exercise should increase your heart rate and make you sweat (moderate-intensity exercise).  Most adults should also do strengthening exercises at least twice a week. This is in addition to the moderate-intensity exercise.  Maintain a healthy weight  Body mass index (BMI) is a measurement that can be used to identify possible weight problems. It estimates body fat based on height and weight. Your health care provider can help determine your BMI and help you achieve or maintain a healthy weight.  For females 20 years of age and older:   A BMI below 18.5 is considered underweight.  A BMI of 18.5 to 24.9 is normal.  A BMI of 25 to 29.9 is considered overweight.  A BMI of 30 and above is considered obese.  Watch levels of cholesterol and blood lipids  You should start having your blood tested for lipids and cholesterol at 41 years of age, then have this test every 5 years.  You may need to have your cholesterol levels checked more often if:  Your lipid  or cholesterol levels are high.  You are older than 41 years of age.  You are at high risk for heart disease.  CANCER SCREENING   Lung Cancer  Lung cancer screening is recommended for adults 55-80 years old who are at high risk for lung cancer because of a history of smoking.  A yearly low-dose CT scan of the lungs is recommended for people who:  Currently smoke.  Have quit within the past 15 years.  Have at least a 30-pack-year history of smoking. A pack year is smoking an average of one pack of cigarettes a day for 1 year.  Yearly screening should continue until it has been 15 years since you quit.  Yearly screening should stop if you develop a health problem that would prevent you from having lung cancer treatment.  Breast Cancer  Practice breast self-awareness. This means understanding how your breasts normally appear and feel.  It also means doing regular breast self-exams. Let your health care provider know about any changes, no matter how small.  If you are in your 20s or 30s, you should have a clinical breast exam (CBE) by a health care provider every 1-3 years as part of a regular health exam.  If you are 40 or older, have a CBE every year. Also consider having a breast X-ray (mammogram) every year.  If you have a family history of breast cancer, talk to your health care provider about genetic screening.  If you   are at high risk for breast cancer, talk to your health care provider about having an MRI and a mammogram every year.  Breast cancer gene (BRCA) assessment is recommended for women who have family members with BRCA-related cancers. BRCA-related cancers include:  Breast.  Ovarian.  Tubal.  Peritoneal cancers.  Results of the assessment will determine the need for genetic counseling and BRCA1 and BRCA2 testing. Cervical Cancer Your health care provider may recommend that you be screened regularly for cancer of the pelvic organs (ovaries, uterus, and  vagina). This screening involves a pelvic examination, including checking for microscopic changes to the surface of your cervix (Pap test). You may be encouraged to have this screening done every 3 years, beginning at age 21.  For women ages 30-65, health care providers may recommend pelvic exams and Pap testing every 3 years, or they may recommend the Pap and pelvic exam, combined with testing for human papilloma virus (HPV), every 5 years. Some types of HPV increase your risk of cervical cancer. Testing for HPV may also be done on women of any age with unclear Pap test results.  Other health care providers may not recommend any screening for nonpregnant women who are considered low risk for pelvic cancer and who do not have symptoms. Ask your health care provider if a screening pelvic exam is right for you.  If you have had past treatment for cervical cancer or a condition that could lead to cancer, you need Pap tests and screening for cancer for at least 20 years after your treatment. If Pap tests have been discontinued, your risk factors (such as having a new sexual partner) need to be reassessed to determine if screening should resume. Some women have medical problems that increase the chance of getting cervical cancer. In these cases, your health care provider may recommend more frequent screening and Pap tests. Colorectal Cancer  This type of cancer can be detected and often prevented.  Routine colorectal cancer screening usually begins at 41 years of age and continues through 41 years of age.  Your health care provider may recommend screening at an earlier age if you have risk factors for colon cancer.  Your health care provider may also recommend using home test kits to check for hidden blood in the stool.  A small camera at the end of a tube can be used to examine your colon directly (sigmoidoscopy or colonoscopy). This is done to check for the earliest forms of colorectal  cancer.  Routine screening usually begins at age 50.  Direct examination of the colon should be repeated every 5-10 years through 41 years of age. However, you may need to be screened more often if early forms of precancerous polyps or small growths are found. Skin Cancer  Check your skin from head to toe regularly.  Tell your health care provider about any new moles or changes in moles, especially if there is a change in a mole's shape or color.  Also tell your health care provider if you have a mole that is larger than the size of a pencil eraser.  Always use sunscreen. Apply sunscreen liberally and repeatedly throughout the day.  Protect yourself by wearing long sleeves, pants, a wide-brimmed hat, and sunglasses whenever you are outside. HEART DISEASE, DIABETES, AND HIGH BLOOD PRESSURE   High blood pressure causes heart disease and increases the risk of stroke. High blood pressure is more likely to develop in:  People who have blood pressure in the high end   of the normal range (130-139/85-89 mm Hg).  People who are overweight or obese.  People who are African American.  If you are 38-23 years of age, have your blood pressure checked every 3-5 years. If you are 61 years of age or older, have your blood pressure checked every year. You should have your blood pressure measured twice--once when you are at a hospital or clinic, and once when you are not at a hospital or clinic. Record the average of the two measurements. To check your blood pressure when you are not at a hospital or clinic, you can use:  An automated blood pressure machine at a pharmacy.  A home blood pressure monitor.  If you are between 45 years and 39 years old, ask your health care provider if you should take aspirin to prevent strokes.  Have regular diabetes screenings. This involves taking a blood sample to check your fasting blood sugar level.  If you are at a normal weight and have a low risk for diabetes,  have this test once every three years after 41 years of age.  If you are overweight and have a high risk for diabetes, consider being tested at a younger age or more often. PREVENTING INFECTION  Hepatitis B  If you have a higher risk for hepatitis B, you should be screened for this virus. You are considered at high risk for hepatitis B if:  You were born in a country where hepatitis B is common. Ask your health care provider which countries are considered high risk.  Your parents were born in a high-risk country, and you have not been immunized against hepatitis B (hepatitis B vaccine).  You have HIV or AIDS.  You use needles to inject street drugs.  You live with someone who has hepatitis B.  You have had sex with someone who has hepatitis B.  You get hemodialysis treatment.  You take certain medicines for conditions, including cancer, organ transplantation, and autoimmune conditions. Hepatitis C  Blood testing is recommended for:  Everyone born from [birth year] through 1966.  Anyone with known risk factors for hepatitis C. Sexually transmitted infections (STIs)  You should be screened for sexually transmitted infections (STIs) including gonorrhea and chlamydia if:  You are sexually active and are younger than 41 years of age.  You are older than 41 years of age and your health care provider tells you that you are at risk for this type of infection.  Your sexual activity has changed since you were last screened and you are at an increased risk for chlamydia or gonorrhea. Ask your health care provider if you are at risk.  If you do not have HIV, but are at risk, it may be recommended that you take a prescription medicine daily to prevent HIV infection. This is called pre-exposure prophylaxis (PrEP). You are considered at risk if:  You are sexually active and do not regularly use condoms or know the HIV status of your partner(s).  You take drugs by injection.  You are sexually  active with a partner who has HIV. Talk with your health care provider about whether you are at high risk of being infected with HIV. If you choose to begin PrEP, you should first be tested for HIV. You should then be tested every 3 months for as long as you are taking PrEP.  PREGNANCY   If you are premenopausal and you may become pregnant, ask your health care provider about preconception counseling.  If you may  become pregnant, take 400 to 800 micrograms (mcg) of folic acid every day.  If you want to prevent pregnancy, talk to your health care provider about birth control (contraception). OSTEOPOROSIS AND MENOPAUSE   Osteoporosis is a disease in which the bones lose minerals and strength with aging. This can result in serious bone fractures. Your risk for osteoporosis can be identified using a bone density scan.  If you are 61 years of age or older, or if you are at risk for osteoporosis and fractures, ask your health care provider if you should be screened.  Ask your health care provider whether you should take a calcium or vitamin D supplement to lower your risk for osteoporosis.  Menopause may have certain physical symptoms and risks.  Hormone replacement therapy may reduce some of these symptoms and risks. Talk to your health care provider about whether hormone replacement therapy is right for you.  HOME CARE INSTRUCTIONS   Schedule regular health, dental, and eye exams.  Stay current with your immunizations.   Do not use any tobacco products including cigarettes, chewing tobacco, or electronic cigarettes.  If you are pregnant, do not drink alcohol.  If you are breastfeeding, limit how much and how often you drink alcohol.  Limit alcohol intake to no more than 1 drink per day for nonpregnant women. One drink equals 12 ounces of beer, 5 ounces of wine, or 1 ounces of hard liquor.  Do not use street drugs.  Do not share needles.  Ask your health care provider for help if  you need support or information about quitting drugs.  Tell your health care provider if you often feel depressed.  Tell your health care provider if you have ever been abused or do not feel safe at home.   This information is not intended to replace advice given to you by your health care provider. Make sure you discuss any questions you have with your health care provider.   Document Released: 11/26/2010 Document Revised: 06/03/2014 Document Reviewed: 04/14/2013 Elsevier Interactive Patient Education [employer].

## 2016-02-21 ENCOUNTER — Telehealth: Payer: Self-pay | Admitting: Internal Medicine

## 2016-02-21 NOTE — Telephone Encounter (Signed)
Pt called needing paperwork filled out for her job. She last saw Dr. Gilford Rile in April for her physical and has an appointment with Dr. Caryl Bis for 04/22/16. She would like to know if Dr. Caryl Bis would be willing to fill out her paperwork. Please Advise. Thank you!  Call pt @ [phone]

## 2016-02-21 NOTE — Telephone Encounter (Signed)
She will need to schedule appointment to see Dr.Sonneberg so she can have updated labs.

## 2016-02-22 ENCOUNTER — Ambulatory Visit
Admission: EM | Admit: 2016-02-22 | Discharge: 2016-02-22 | Discharge status: Absent without leave | Payer: Managed Care, Other (non HMO)

## 2016-02-22 NOTE — Telephone Encounter (Signed)
Called left message for pt regarding paperwork.

## 2016-03-04 ENCOUNTER — Ambulatory Visit: Payer: Managed Care, Other (non HMO) | Admitting: Internal Medicine

## 2016-04-17 ENCOUNTER — Other Ambulatory Visit: Payer: Self-pay | Admitting: Obstetrics and Gynecology

## 2016-04-17 DIAGNOSIS — Z1231 Encounter for screening mammogram for malignant neoplasm of breast: Secondary | ICD-10-CM

## 2016-04-22 ENCOUNTER — Ambulatory Visit (INDEPENDENT_AMBULATORY_CARE_PROVIDER_SITE_OTHER): Payer: BC Managed Care – PPO | Admitting: Family Medicine

## 2016-04-22 ENCOUNTER — Encounter: Payer: Self-pay | Admitting: Family Medicine

## 2016-04-22 VITALS — BP 132/88 | HR 88 | Temp 98.3°F | Wt 168.6 lb

## 2016-04-22 DIAGNOSIS — G43109 Migraine with aura, not intractable, without status migrainosus: Secondary | ICD-10-CM

## 2016-04-22 DIAGNOSIS — Z23 Encounter for immunization: Secondary | ICD-10-CM | POA: Diagnosis not present

## 2016-04-22 DIAGNOSIS — J309 Allergic rhinitis, unspecified: Secondary | ICD-10-CM

## 2016-04-22 DIAGNOSIS — I10 Essential (primary) hypertension: Secondary | ICD-10-CM

## 2016-04-22 DIAGNOSIS — E8801 Alpha-1-antitrypsin deficiency: Secondary | ICD-10-CM

## 2016-04-22 MED ORDER — SUMATRIPTAN SUCCINATE 50 MG PO TABS: 50.0000 mg | ORAL_TABLET | Freq: Once | ORAL | 11 refills | Status: DC | PRN

## 2016-04-22 NOTE — Assessment & Plan Note (Signed)
Well-controlled today. Continue current medications. Check CMP today.

## 2016-04-22 NOTE — Assessment & Plan Note (Signed)
Continue to follow with GI. Check LFTs today.

## 2016-04-22 NOTE — Progress Notes (Signed)
  Tommi Rumps, MD Phone: [phone]  Barbara Ortega is a 40 y.o. female who presents today for follow-up.  HYPERTENSION  Disease Monitoring  Home BP Monitoring 948 systolic Chest pain- no    Dyspnea- no Medications  Compliance-  Taking HCTZ.  Edema- no  Allergic rhinitis: Taking Flonase and Zyrtec. These do help. She had sinus surgery in the past. Typically gets sinus pressure and congestion. No rhinorrhea. No symptoms at this time.  Migraines: Gets one every couple months. Takes Imitrex. Typically only has to take it once. Asymptomatic at this time. Notes some aura and phonophobia with it. No photophobia. Intermittent nausea with it. Notes her migraines have significantly improved over the last year.  Alpha-1 antitrypsin deficiency: Sees GI annually. Had a biopsy of her liver that revealed no damage. Most recent LFTs of been in the normal range.  PMH: nonsmoker.   ROS see history of present illness  Objective  Physical Exam Vitals:   04/22/16 1536  BP: 132/88  Pulse: 88  Temp: 98.3 F (36.8 C)    BP Readings from Last 3 Encounters:  04/22/16 132/88  09/01/15 136/89  07/30/15 122/82   Wt Readings from Last 3 Encounters:  04/22/16 168 lb 9.6 oz (76.5 kg)  09/01/15 182 lb 2 oz (82.6 kg)  07/30/15 174 lb (78.9 kg)    Physical Exam  Constitutional: She is well-developed, well-nourished, and in no distress.  HENT:  Head: Normocephalic and atraumatic.  Mouth/Throat: Oropharynx is clear and moist.  Eyes: Conjunctivae are normal. Pupils are equal, round, and reactive to light.  Cardiovascular: Normal rate, regular rhythm and normal heart sounds.   Pulmonary/Chest: Effort normal and breath sounds normal.  Abdominal: Soft. Bowel sounds are normal. She exhibits no distension. There is no tenderness.  Neurological: She is alert.  CN 2-12 intact, 5/5 strength in bilateral biceps, triceps, grip, quads, hamstrings, plantar and dorsiflexion, sensation to light touch intact  in bilateral UE and LE, normal gait  Skin: Skin is warm and dry.     Assessment/Plan: Please see individual problem list.  Alpha-1-antitrypsin deficiency Continue to follow with GI. Check LFTs today.  Allergic rhinitis Currently asymptomatic. Continue Flonase and Zyrtec.  Migraine with aura Have improved over the last year. Refill Imitrex. Continue to monitor.  Essential hypertension, benign Well-controlled today. Continue current medications. Check CMP today.   Orders Placed This Encounter  Procedures  . Comp Met (CMET)    Meds ordered this encounter  Medications  . SUMAtriptan (IMITREX) 50 MG tablet    Sig: Take 1 tablet (50 mg total) by mouth once as needed for migraine. May repeat in 2 hours if headache persists or recurs. Do not exceed 2 tablets in 24 hour time period.    Dispense:  10 tablet    Refill:  [place], MD [place]

## 2016-04-22 NOTE — Patient Instructions (Addendum)
Nice to see you. We will refill your Imitrex. We will check some lab work and call you with the results.

## 2016-04-22 NOTE — Assessment & Plan Note (Signed)
Have improved over the last year. Refill Imitrex. Continue to monitor.

## 2016-04-22 NOTE — Assessment & Plan Note (Signed)
Currently asymptomatic. Continue Flonase and Zyrtec.

## 2016-04-22 NOTE — Progress Notes (Signed)
Pre visit review using our clinic review tool, if applicable. No additional management support is needed unless otherwise documented below in the visit note. 

## 2016-04-23 LAB — COMPREHENSIVE METABOLIC PANEL
ALT: 54 U/L — AB (ref 0–35)
AST: 25 U/L (ref 0–37)
Albumin: 4.6 g/dL (ref 3.5–5.2)
Alkaline Phosphatase: 63 U/L (ref 39–117)
BILIRUBIN TOTAL: 0.4 mg/dL (ref 0.2–1.2)
BUN: 11 mg/dL (ref 6–23)
CALCIUM: 9.7 mg/dL (ref 8.4–10.5)
CO2: 28 meq/L (ref 19–32)
CREATININE: 0.82 mg/dL (ref 0.40–1.20)
Chloride: 102 mEq/L (ref 96–112)
GFR: 81.57 mL/min (ref >60.00)
Glucose, Bld: 76 mg/dL (ref 70–99)
Potassium: 3.8 mEq/L (ref 3.5–5.1)
Sodium: 138 mEq/L (ref 135–145)
TOTAL PROTEIN: 7.7 g/dL (ref 6.0–8.3)

## 2016-05-24 ENCOUNTER — Ambulatory Visit
Admission: RE | Admit: 2016-05-24 | Discharge: 2016-05-24 | Disposition: A | Discharge status: Home / Self Care | Payer: BC Managed Care – PPO | Source: Ambulatory Visit | Attending: Obstetrics and Gynecology | Admitting: Obstetrics and Gynecology

## 2016-05-24 DIAGNOSIS — Z1231 Encounter for screening mammogram for malignant neoplasm of breast: Secondary | ICD-10-CM

## 2016-07-19 ENCOUNTER — Encounter: Payer: Self-pay | Admitting: Emergency Medicine

## 2016-07-19 ENCOUNTER — Ambulatory Visit
Admission: EM | Admit: 2016-07-19 | Discharge: 2016-07-19 | Disposition: A | Discharge status: Home / Self Care | Payer: BC Managed Care – PPO | Attending: Emergency Medicine | Admitting: Emergency Medicine

## 2016-07-19 DIAGNOSIS — J0110 Acute frontal sinusitis, unspecified: Secondary | ICD-10-CM

## 2016-07-19 DIAGNOSIS — J020 Streptococcal pharyngitis: Secondary | ICD-10-CM | POA: Diagnosis not present

## 2016-07-19 LAB — RAPID STREP SCREEN (MED CTR MEBANE ONLY): Streptococcus, Group A Screen (Direct): POSITIVE — AB

## 2016-07-19 MED ORDER — AMOXICILLIN-POT CLAVULANATE 875-125 MG PO TABS: 1.0000 | ORAL_TABLET | Freq: Two times a day (BID) | ORAL | 0 refills | Status: AC

## 2016-07-19 MED ORDER — FLUTICASONE PROPIONATE 50 MCG/ACT NA SUSP: 2.0000 | Freq: Every day | NASAL | 0 refills | Status: DC

## 2016-07-19 MED ORDER — PENICILLIN G BENZATHINE 1200000 UNIT/2ML IM SUSP: 1.2000 10*6.[IU] | Freq: Once | INTRAMUSCULAR | Status: DC

## 2016-07-19 MED ORDER — IBUPROFEN 800 MG PO TABS: 800.0000 mg | ORAL_TABLET | Freq: Three times a day (TID) | ORAL | 0 refills | Status: DC | PRN

## 2016-07-19 NOTE — ED Provider Notes (Signed)
HPI  SUBJECTIVE:  Patient reports sore throat starting last night. Sx worse with swallowing.  Sx better with Tylenol.  no Fever  no Swollen neck glands    no Cough + Purulent nasal congestion x 3 weeks. States that her upper teeth hurt. No facial swelling. + Myalgias + Diffuse headache starting last night. 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  + antipyretic in past 6-8 hrs - tylenol PMH alpha 1 trypsin deficiency. HTN, migraines, sinusitis No h/o recurrent strep, mono, DM LMP: has IUD. No chance pregnancy PMD: Tommi Rumps, MD    Past Medical History:  Diagnosis Date  . Sinus disease     Past Surgical History:  Procedure Laterality Date  . BREAST BIOPSY     left benign  . NASAL SINUS SURGERY  2011   Dr. Kathyrn Sheriff  . VAGINAL DELIVERY     1  . WISDOM TOOTH EXTRACTION      Family History  Problem Relation Age of Onset  . Hypertension Mother   . Cancer Father     lung    Social History  Substance Use Topics  . Smoking status: Never Smoker  . Smokeless tobacco: Never Used  . Alcohol use No    No current facility-administered medications for this encounter.   Current Outpatient Prescriptions:  .  amoxicillin-clavulanate (AUGMENTIN) 875-125 MG tablet, Take 1 tablet by mouth 2 (two) times daily. X, Disp: 20 tablet, Rfl: 0 .  cetirizine (ZYRTEC) 10 MG tablet, Take 10 mg by mouth daily., Disp: , Rfl:  .  fluticasone (FLONASE) 50 MCG/ACT nasal spray, Place 2 sprays into both nostrils daily., Disp: 16 g, Rfl: 0 .  hydrochlorothiazide (MICROZIDE) 12.5 MG capsule, Take 1 capsule (12.5 mg total) by mouth daily., Disp: 90 capsule, Rfl: 3 .  ibuprofen (ADVIL,MOTRIN) 800 MG tablet, Take 1 tablet (800 mg total) by mouth every  8 (eight) hours as needed., Disp: 30 tablet, Rfl: 0 .  SUMAtriptan (IMITREX) 50 MG tablet, Take 1 tablet (50 mg total) by mouth once as needed for migraine. May repeat in 2 hours if headache persists or recurs. Do not exceed 2 tablets in 24 hour time period., Disp: 10 tablet, Rfl: 11  Allergies  Allergen Reactions  . Sulfur Hives     ROS  As noted in HPI.   Physical Exam  BP 131/78 (BP Location: Left Arm)   Pulse 81   Temp 98.8 F (37.1 C) (Oral)   Resp 16   Ht 5\' 3"  (1.6 m)   Wt 165 lb (74.8 kg)   SpO2 100%   BMI 29.23 kg/m   Constitutional: Well developed, well nourished, no acute distress Eyes:  EOMI, conjunctiva normal bilaterally HENT: Normocephalic, atraumatic,mucus membranes moist. + Mucopurulent nasal congestion, erythematous turbinates. Positive frontal sinus tenderness. No maxillary sinus tenderness. + erythematous oropharynx - enlarged tonsils - exudates. Uvula midline. Marland Kitchen Positive postnasal drip. Respiratory: Normal inspiratory effort Cardiovascular: Normal rate, no murmurs, rubs, gallops GI: nondistended, nontender. No appreciable splenomegaly skin: No rash, skin intact Lymph: - cervical LN  Musculoskeletal: no deformities Neurologic: Alert & oriented x 3, no focal neuro deficits Psychiatric: Speech and behavior appropriate.  ED Course   Medications - No data to display  Orders Placed This Encounter  Procedures  . Rapid strep screen    Standing Status:   Standing    Number of Occurrences:   1    Results for orders placed or performed during the hospital encounter of 07/19/16 (from the past 24 hour(s))  Rapid strep screen     Status: Abnormal   Collection Time: 07/19/16  5:58 PM  Result Value Ref Range   Streptococcus, Group A Screen (Direct) POSITIVE (A) NEGATIVE   No results found.  ED Clinical Impression  Strep pharyngitis  Acute frontal sinusitis, recurrence not specified   ED Assessment/Plan  Rapid strep positive. Patient also appears  to have a frontal sinusitis. Given her history of sinusitis status post surgery and the fact that she has had her symptoms for 3 weeks, will send her home with Augmentin which will treat both strep and sinus infection. She is to start Zyrtec D, and if this does not help and she'll discontinue Zyrtec-D and start Mucinex D. She will continue saline nasal irrigation, Flonase. Prescribing ibuprofen 800 mg with 1 g of Tylenol 3 times a day as well. Patient to followup with PMD when necessary.  Discussed labs, MDM, plan and followup with patient. Discussed sn/sx that should prompt return to the  ED. Patient agrees with plan.   Meds ordered this encounter  Medications  . DISCONTD: penicillin g benzathine (BICILLIN LA) 1200000 UNIT/2ML injection 1.2 Million Units  . fluticasone (FLONASE) 50 MCG/ACT nasal spray    Sig: Place 2 sprays into both nostrils daily.    Dispense:  16 g    Refill:  0  . amoxicillin-clavulanate (AUGMENTIN) 875-125 MG tablet    Sig: Take 1 tablet by mouth 2 (two) times daily. X    Dispense:  20 tablet    Refill:  0  . ibuprofen (ADVIL,MOTRIN) 800 MG tablet    Sig: Take 1 tablet (800 mg total) by mouth every 8 (eight) hours as needed.    Dispense:  30 tablet    Refill:  0     *This clinic note was created using [employer]. Therefore, there may be occasional mistakes despite careful proofreading.    Melynda Ripple, MD 07/19/16 [phone]

## 2016-07-19 NOTE — Discharge Instructions (Signed)
Zyrtec D. Start mucinex D if this doesn't work. Stop zyrtec if taking mucinex. Continue saline nasal irrigation,. Flonase. 800 mg ibuprofen with 1 gram of tylenol 3 times a day.

## 2016-07-19 NOTE — ED Triage Notes (Signed)
Patient sore throat, congestion, sinus congestion, that started last night.

## 2016-08-20 ENCOUNTER — Ambulatory Visit: Payer: BC Managed Care – PPO | Admitting: Family Medicine

## 2016-09-10 ENCOUNTER — Ambulatory Visit (INDEPENDENT_AMBULATORY_CARE_PROVIDER_SITE_OTHER): Payer: BC Managed Care – PPO | Admitting: Family Medicine

## 2016-09-10 ENCOUNTER — Other Ambulatory Visit: Payer: Self-pay

## 2016-09-10 ENCOUNTER — Encounter: Payer: Self-pay | Admitting: Family Medicine

## 2016-09-10 VITALS — BP 112/80 | HR 73 | Temp 98.5°F | Wt 173.8 lb

## 2016-09-10 DIAGNOSIS — I10 Essential (primary) hypertension: Secondary | ICD-10-CM

## 2016-09-10 DIAGNOSIS — E8801 Alpha-1-antitrypsin deficiency: Secondary | ICD-10-CM | POA: Diagnosis not present

## 2016-09-10 DIAGNOSIS — G43109 Migraine with aura, not intractable, without status migrainosus: Secondary | ICD-10-CM

## 2016-09-10 DIAGNOSIS — K760 Fatty (change of) liver, not elsewhere classified: Secondary | ICD-10-CM

## 2016-09-10 MED ORDER — HYDROCHLOROTHIAZIDE 12.5 MG PO CAPS: 12.5000 mg | ORAL_CAPSULE | Freq: Every day | ORAL | 3 refills | Status: DC

## 2016-09-10 NOTE — Assessment & Plan Note (Signed)
Well controlled. Continue current medications  

## 2016-09-10 NOTE — Patient Instructions (Signed)
Nice to see you. Please continue your hydrochlorothiazide. It was recommended that she follow up 1 year after year prior visit with GI physician. We can help arrange this if you would like.

## 2016-09-10 NOTE — Assessment & Plan Note (Signed)
Refer back to GI for follow-up. Encouraged to limit alcohol and Tylenol.

## 2016-09-10 NOTE — Assessment & Plan Note (Signed)
We will refer back to GI for follow-up.

## 2016-09-10 NOTE — Progress Notes (Signed)
Pre visit review using our clinic review tool, if applicable. No additional management support is needed unless otherwise documented below in the visit note. 

## 2016-09-10 NOTE — Assessment & Plan Note (Signed)
No recent headaches. She'll monitor for recurrence.

## 2016-09-10 NOTE — Progress Notes (Signed)
  Tommi Rumps, MD Phone: [phone]  Barbara Ortega is a 41 y.o. female who presents today for f/u.  HYPERTENSION  Disease Monitoring  Home BP Monitoring 120/80-90 Chest pain- no    Dyspnea- no Medications  Compliance-  Taking HCTZ.  Edema- no  Fatty liver/alpha-1 antitrypsin deficiency: Patient with 2 types of liver disease. No right upper quadrant pain. Rarely gets nausea about once every 5 months and occasionally vomits. No blood in this. Rare alcohol use. Rare Tylenol use. Has not followed up with GI in some time.  Migraines: Hasn't had one in about 2 years. Has not had to use Imitrex recently. In the past with her headaches it would start on one side then moved to the other the next day. She would have phonophobia and smell sensitivity. No numbness or weakness. Some nausea with it. Does have some aura with it. No photophobia.   PMH: nonsmoker.   ROS see history of present illness  Objective  Physical Exam Vitals:   09/10/16 1604  BP: 112/80  Pulse: 73  Temp: 98.5 F (36.9 C)    BP Readings from Last 3 Encounters:  09/10/16 112/80  07/19/16 131/78  04/22/16 132/88   Wt Readings from Last 3 Encounters:  09/10/16 173 lb 12.8 oz (78.8 kg)  07/19/16 165 lb (74.8 kg)  04/22/16 168 lb 9.6 oz (76.5 kg)    Physical Exam  Constitutional: No distress.  Cardiovascular: Normal rate, regular rhythm and normal heart sounds.   Pulmonary/Chest: Effort normal and breath sounds normal.  Abdominal: Soft. Bowel sounds are normal. She exhibits no distension. There is no tenderness. There is no rebound and no guarding.  Musculoskeletal: She exhibits no edema.  Neurological: She is alert. Gait normal.  Skin: Skin is warm and dry. She is not diaphoretic.     Assessment/Plan: Please see individual problem list.  Migraine with aura No recent headaches. She'll monitor for recurrence.  Essential hypertension, benign Well-controlled. Continue current  medications.  Alpha-1-antitrypsin deficiency We will refer back to GI for follow-up.  Fatty infiltration of liver Refer back to GI for follow-up. Encouraged to limit alcohol and Tylenol.   Orders Placed This Encounter  Procedures  . Ambulatory referral to Gastroenterology    Referral Priority:   Routine    Referral Type:   Consultation    Referral Reason:   Specialty Services Required    Number of Visits Requested:   [place], MD [place]

## 2016-09-10 NOTE — Telephone Encounter (Signed)
Last OV 04/22/16 last filled by Dr.Walker 09/01/15 90 3rf

## 2017-04-07 ENCOUNTER — Other Ambulatory Visit: Payer: Self-pay | Admitting: Family Medicine

## 2017-04-07 ENCOUNTER — Other Ambulatory Visit: Payer: Self-pay | Admitting: Obstetrics and Gynecology

## 2017-04-07 DIAGNOSIS — Z1231 Encounter for screening mammogram for malignant neoplasm of breast: Secondary | ICD-10-CM

## 2017-05-26 ENCOUNTER — Ambulatory Visit
Admission: RE | Admit: 2017-05-26 | Discharge: 2017-05-26 | Disposition: A | Discharge status: Home / Self Care | Payer: BC Managed Care – PPO | Source: Ambulatory Visit | Attending: Family Medicine | Admitting: Family Medicine

## 2017-05-26 DIAGNOSIS — Z1231 Encounter for screening mammogram for malignant neoplasm of breast: Secondary | ICD-10-CM

## 2017-05-27 HISTORY — PX: BREAST BIOPSY: SHX20

## 2017-05-28 ENCOUNTER — Other Ambulatory Visit: Payer: Self-pay | Admitting: Family Medicine

## 2017-05-28 DIAGNOSIS — R928 Other abnormal and inconclusive findings on diagnostic imaging of breast: Secondary | ICD-10-CM

## 2017-06-02 ENCOUNTER — Ambulatory Visit
Admission: RE | Admit: 2017-06-02 | Discharge: 2017-06-02 | Disposition: A | Discharge status: Home / Self Care | Payer: BC Managed Care – PPO | Source: Ambulatory Visit | Attending: Family Medicine | Admitting: Family Medicine

## 2017-06-02 ENCOUNTER — Other Ambulatory Visit: Payer: Self-pay | Admitting: Family Medicine

## 2017-06-02 DIAGNOSIS — R921 Mammographic calcification found on diagnostic imaging of breast: Secondary | ICD-10-CM

## 2017-06-02 DIAGNOSIS — R928 Other abnormal and inconclusive findings on diagnostic imaging of breast: Secondary | ICD-10-CM

## 2017-06-05 ENCOUNTER — Other Ambulatory Visit: Payer: Self-pay | Admitting: Family Medicine

## 2017-06-05 DIAGNOSIS — R921 Mammographic calcification found on diagnostic imaging of breast: Secondary | ICD-10-CM

## 2017-06-06 ENCOUNTER — Ambulatory Visit
Admission: RE | Admit: 2017-06-06 | Discharge: 2017-06-06 | Disposition: A | Discharge status: Home / Self Care | Payer: BC Managed Care – PPO | Source: Ambulatory Visit | Attending: Family Medicine | Admitting: Family Medicine

## 2017-06-06 DIAGNOSIS — R921 Mammographic calcification found on diagnostic imaging of breast: Secondary | ICD-10-CM

## 2017-06-28 ENCOUNTER — Ambulatory Visit
Admission: EM | Admit: 2017-06-28 | Discharge: 2017-06-28 | Disposition: A | Discharge status: Home / Self Care | Payer: BC Managed Care – PPO | Attending: Family Medicine | Admitting: Family Medicine

## 2017-06-28 ENCOUNTER — Other Ambulatory Visit: Payer: Self-pay

## 2017-06-28 ENCOUNTER — Encounter: Payer: Self-pay | Admitting: Gynecology

## 2017-06-28 DIAGNOSIS — J069 Acute upper respiratory infection, unspecified: Secondary | ICD-10-CM | POA: Diagnosis not present

## 2017-06-28 HISTORY — DX: Migraine, unspecified, not intractable, without status migrainosus: G43.909

## 2017-06-28 HISTORY — DX: Essential (primary) hypertension: I10

## 2017-06-28 LAB — RAPID STREP SCREEN (MED CTR MEBANE ONLY): Streptococcus, Group A Screen (Direct): NEGATIVE

## 2017-06-28 MED ORDER — AMOXICILLIN-POT CLAVULANATE 875-125 MG PO TABS: 1.0000 | ORAL_TABLET | Freq: Two times a day (BID) | ORAL | 0 refills | Status: DC

## 2017-06-28 NOTE — ED Triage Notes (Signed)
Per patient x 3 days sore throat / drainage.

## 2017-06-28 NOTE — ED Provider Notes (Signed)
[facility]    CSN: [id number] Arrival date & time: 06/28/17  1111  History   Chief Complaint Chief Complaint  Patient presents with  . Sore Throat   HPI  42 year old female presents with upper respiratory symptoms.  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  Patient has multiple sick contacts.  She states that her children have had colds.  States that she has not been feeling well since Thursday.  She has had fatigue, sore throat, drainage, nausea, ear pain, and headache. Pain currently 5/10 in severity. No fever.  No known exacerbating relieving factors.  No other reported symptoms.  No other complaints this time.  Past Medical History:  Diagnosis Date  . Hypertension   . Migraine   . Sinus disease     Patient Active Problem List   Diagnosis Date Noted  . Contraceptive management 09/21/2014  . Alpha-1-antitrypsin deficiency ([place]) 08/22/2014  . Migraine with aura 08/22/2014  . Fatty infiltration of liver 08/11/2013  . Essential hypertension, benign 09/07/2012  . Allergic rhinitis 08/05/2012    Past Surgical History:  Procedure Laterality Date  . BREAST BIOPSY Left    Benign  . NASAL SINUS SURGERY  2011   Dr. Kathyrn Sheriff  . VAGINAL DELIVERY     1  . WISDOM TOOTH EXTRACTION      OB History    No data available       Home Medications    Prior to Admission medications   Medication Sig Start Date End Date Taking? Authorizing Provider  cetirizine (ZYRTEC) 10 MG tablet Take 10 mg by mouth daily.   Yes [provider]  fluticasone (FLONASE) 50 MCG/ACT nasal spray Place 2 sprays into both nostrils daily. 07/19/16  Yes Melynda Ripple, MD  hydrochlorothiazide (MICROZIDE) 12.5 MG capsule Take 1 capsule (12.5 mg total) by mouth daily. 09/10/16  Yes Leone Haven, MD  ibuprofen (ADVIL,MOTRIN) 800 MG tablet Take 1 tablet (800 mg total) by mouth every 8 (eight) hours as needed. 07/19/16  Yes Melynda Ripple, MD  levonorgestrel  (MIRENA) 20 MCG/24HR IUD 1 each by Intrauterine route once.   Yes [provider]  SUMAtriptan (IMITREX) 50 MG tablet Take 1 tablet (50 mg total) by mouth once as needed for migraine. May repeat in 2 hours if headache persists or recurs. Do not exceed 2 tablets in 24 hour time period. 04/22/16  Yes Leone Haven, MD  amoxicillin-clavulanate (AUGMENTIN) 875-125 MG tablet Take 1 tablet by mouth every 12 (twelve) hours. 06/28/17   Coral Spikes, DO    Family History Family History  Problem Relation Age of Onset  . Hypertension Mother   . Cancer Father        lung  . Breast cancer Maternal Aunt     Social History Social History   Tobacco Use  . Smoking status: Never Smoker  . Smokeless tobacco: Never Used  Substance Use Topics  . Alcohol use: No    Alcohol/week: 0.0 oz  . Drug use: No     Allergies   Sulfur   Review of Systems Review of Systems  Constitutional: Positive for fatigue. Negative for fever.  HENT: Positive for ear pain and sore throat.   Gastrointestinal: Positive for nausea.  Neurological: Positive for headaches.   Physical Exam Triage Vital Signs ED Triage Vitals  Enc Vitals Group     BP 06/28/17 1120 140/90     Pulse Rate 06/28/17 1120 75     Resp 06/28/17 1120  16     Temp 06/28/17 1120 97.9 F (36.6 C)     Temp Source 06/28/17 1120 Oral     SpO2 06/28/17 1120 100 %     Weight 06/28/17 1121 180 lb (81.6 kg)     Height 06/28/17 1121 5\' 3"  (1.6 m)     Head Circumference --      Peak Flow --      Pain Score 06/28/17 1121 5     Pain Loc --      Pain Edu? --      Excl. in [place]? --     Updated Vital Signs BP 140/90 (BP Location: Left Arm)   Pulse 75   Temp 97.9 F (36.6 C) (Oral)   Resp 16   Ht 5\' 3"  (1.6 m)   Wt 180 lb (81.6 kg)   SpO2 100%   BMI 31.89 kg/m     Physical Exam  Constitutional: She is oriented to person, place, and time. She appears well-developed and well-nourished. No distress.  HENT:  Head: Normocephalic and  atraumatic.  Oropharynx mild erythema.  No exudate.  Normal TMs bilaterally.  Eyes: Conjunctivae are normal.  Neck: Neck supple.  Cardiovascular: Normal rate and regular rhythm.  No murmur heard. Pulmonary/Chest: Effort normal and breath sounds normal. She has no wheezes. She has no rales.  Lymphadenopathy:    She has no cervical adenopathy.  Neurological: She is oriented to person, place, and time.  Psychiatric: She has a normal mood and affect. Her behavior is normal.  Nursing note and vitals reviewed.  UC Treatments / Results  Labs (all labs ordered are listed, but only abnormal results are displayed) Labs Reviewed  RAPID STREP SCREEN (NOT AT [facility])  CULTURE, GROUP A STREP [facility])    EKG  EKG Interpretation None       Radiology No results found.  Procedures Procedures (including critical care time)  Medications Ordered in UC Medications - No data to display   Initial Impression / Assessment and Plan / UC Course  I have reviewed the triage vital signs and the nursing notes.  Pertinent labs & imaging results that were available during my care of the patient were reviewed by me and considered in my medical decision making (see chart for details).     42 year old female presents with an upper respiratory infection.  Rapid strep negative.  Likely viral in origin.  Advised supportive care and Flonase and ibuprofen.  Antibiotic if she fails to improve or worsens.  Final Clinical Impressions(s) / UC Diagnoses   Final diagnoses:  Upper respiratory tract infection, unspecified type    ED Discharge Orders        Ordered    amoxicillin-clavulanate (AUGMENTIN) 875-125 MG tablet  Every 12 hours     06/28/17 1141     Controlled Substance Prescriptions [place] Controlled Substance Registry consulted? Not Applicable   Coral Spikes, DO 06/28/17 1142

## 2017-06-28 NOTE — Discharge Instructions (Signed)
This is likely viral in nature.  OTC Flonase, Ibuprofen as needed.  Rest, fluids.  If you fail to improve you can start the antibiotic.  Take care  Dr. Lacinda Axon

## 2017-07-01 LAB — CULTURE, GROUP A STREP (THRC)

## 2017-09-29 ENCOUNTER — Other Ambulatory Visit: Payer: Self-pay | Admitting: Family Medicine

## 2017-09-29 DIAGNOSIS — I10 Essential (primary) hypertension: Secondary | ICD-10-CM

## 2018-01-18 ENCOUNTER — Other Ambulatory Visit: Payer: Self-pay | Admitting: Family Medicine

## 2018-01-18 DIAGNOSIS — I10 Essential (primary) hypertension: Secondary | ICD-10-CM

## 2018-01-19 ENCOUNTER — Ambulatory Visit (INDEPENDENT_AMBULATORY_CARE_PROVIDER_SITE_OTHER): Payer: BC Managed Care – PPO

## 2018-01-19 ENCOUNTER — Ambulatory Visit
Admission: EM | Admit: 2018-01-19 | Discharge: 2018-01-19 | Disposition: A | Discharge status: Home / Self Care | Payer: BC Managed Care – PPO | Attending: Family Medicine | Admitting: Family Medicine

## 2018-01-19 ENCOUNTER — Encounter: Payer: Self-pay | Admitting: Emergency Medicine

## 2018-01-19 ENCOUNTER — Other Ambulatory Visit: Payer: Self-pay

## 2018-01-19 DIAGNOSIS — M25571 Pain in right ankle and joints of right foot: Secondary | ICD-10-CM

## 2018-01-19 DIAGNOSIS — S93401A Sprain of unspecified ligament of right ankle, initial encounter: Secondary | ICD-10-CM

## 2018-01-19 MED ORDER — NAPROXEN 500 MG PO TABS: 500.0000 mg | ORAL_TABLET | Freq: Two times a day (BID) | ORAL | 0 refills | Status: DC

## 2018-01-19 NOTE — ED Triage Notes (Signed)
Pt c/o right ankle pain. Started this morning, has gotten worse over the the day. No known injury. Bearing weight makes it worse.

## 2018-01-19 NOTE — ED Provider Notes (Addendum)
[facility]    CSN: [id number] Arrival date & time: 01/19/18  1832     History   Chief Complaint Chief Complaint  Patient presents with  . Ankle Pain    right    HPI Barbara Ortega is a 42 y.o. female.   Patient is a 42 year old female who presents with complaint of pain to her right ankle.  She states her ankle was fine last night when she went to bed when she woke up this morning it was little swollen and painful.  States the pain has worsened as the day went on.  She has taken some ibuprofen.  Patient states the pain is worse with standing and putting pressure on it.  She remembers no injuries, trauma or missteps she can think of to cause this pain and swelling.     Past Medical History:  Diagnosis Date  . Hypertension   . Migraine   . Sinus disease     Patient Active Problem List   Diagnosis Date Noted  . Contraceptive management 09/21/2014  . Alpha-1-antitrypsin deficiency ([place]) 08/22/2014  . Migraine with aura 08/22/2014  . Fatty infiltration of liver 08/11/2013  . Essential hypertension, benign 09/07/2012  . Allergic rhinitis 08/05/2012    Past Surgical History:  Procedure Laterality Date  . BREAST BIOPSY Left    Benign  . NASAL SINUS SURGERY  2011   Dr. Kathyrn Sheriff  . VAGINAL DELIVERY     1  . WISDOM TOOTH EXTRACTION      OB History   None      Home Medications    Prior to Admission medications   Medication Sig Start Date End Date Taking? Authorizing Provider  cetirizine (ZYRTEC) 10 MG tablet Take 10 mg by mouth daily.   Yes [provider]  fluticasone (FLONASE) 50 MCG/ACT nasal spray Place 2 sprays into both nostrils daily. 07/19/16  Yes Melynda Ripple, MD  hydrochlorothiazide (MICROZIDE) 12.5 MG capsule TAKE 1 CAPSULE(12.5 MG) BY MOUTH DAILY 09/29/17  Yes Leone Haven, MD  ibuprofen (ADVIL,MOTRIN) 800 MG tablet Take 1 tablet (800 mg total) by mouth every 8 (eight) hours as needed. 07/19/16  Yes Melynda Ripple, MD    levonorgestrel (MIRENA) 20 MCG/24HR IUD 1 each by Intrauterine route once.   Yes [provider]  SUMAtriptan (IMITREX) 50 MG tablet Take 1 tablet (50 mg total) by mouth once as needed for migraine. May repeat in 2 hours if headache persists or recurs. Do not exceed 2 tablets in 24 hour time period. 04/22/16  Yes Leone Haven, MD  amoxicillin-clavulanate (AUGMENTIN) 875-125 MG tablet Take 1 tablet by mouth every 12 (twelve) hours. 06/28/17   Coral Spikes, DO  naproxen (NAPROSYN) 500 MG tablet Take 1 tablet (500 mg total) by mouth 2 (two) times daily. 01/19/18   Luvenia Redden, PA-C    Family History Family History  Problem Relation Age of Onset  . Hypertension Mother   . Cancer Father        lung  . Breast cancer Maternal Aunt     Social History Social History   Tobacco Use  . Smoking status: Never Smoker  . Smokeless tobacco: Never Used  Substance Use Topics  . Alcohol use: No    Alcohol/week: 0.0 standard drinks  . Drug use: No     Allergies   Sulfur   Review of Systems Review of Systems as noted above in HPI.  Other systems reviewed and found to be negative  Physical Exam Triage Vital Signs ED Triage Vitals  Enc Vitals Group     BP 01/19/18 1844 135/84     Pulse Rate 01/19/18 1844 85     Resp 01/19/18 1844 16     Temp 01/19/18 1844 98.8 F (37.1 C)     Temp Source 01/19/18 1844 Oral     SpO2 01/19/18 1844 99 %     Weight 01/19/18 1845 180 lb (81.6 kg)     Height 01/19/18 1845 5\' 3"  (1.6 m)     Head Circumference --      Peak Flow --      Pain Score 01/19/18 1843 7     Pain Loc --      Pain Edu? --      Excl. in [place]? --    No data found.  Updated Vital Signs BP 135/84 (BP Location: Right Arm)   Pulse 85   Temp 98.8 F (37.1 C) (Oral)   Resp 16   Ht 5\' 3"  (1.6 m)   Wt 180 lb (81.6 kg)   SpO2 99%   BMI 31.89 kg/m    Physical Exam  Constitutional: She appears well-developed and well-nourished. No distress.  Eyes: Pupils are  equal, round, and reactive to light. EOM are normal.  Neck: Normal range of motion.  Musculoskeletal:       Right ankle: She exhibits swelling. Tenderness. Lateral malleolus tenderness found.       Feet:     UC Treatments / Results  Labs (all labs ordered are listed, but only abnormal results are displayed) Labs Reviewed - No data to display  EKG None  Radiology Dg Ankle Complete Right  Result Date: 01/19/2018 CLINICAL DATA:  Nontraumatic RIGHT ankle pain today. EXAM: RIGHT ANKLE - COMPLETE 3+ VIEW COMPARISON:  None. FINDINGS: There is no evidence of fracture, dislocation, or joint effusion. There is no evidence of arthropathy. Chronic talar beak avulsion. Slight lateral soft tissue swelling. IMPRESSION: Slight lateral soft tissue swelling. No acute fracture or dislocation. Chronic talar beak avulsion. Electronically Signed   By: Staci Righter M.D.   On: 01/19/2018 19:18    Procedures Procedures (including critical care time)  Medications Ordered in UC Medications - No data to display  Initial Impression / Assessment and Plan / UC Course  I have reviewed the triage vital signs and the nursing notes.  Pertinent labs & imaging results that were available during my care of the patient were reviewed by me and considered in my medical decision making (see chart for details).     Patient complains of pain to right ankle.  Patient has some tenderness around the left malleolus with inversion and some edema.  No trauma, misstep, or accident that she remembers.  X-ray as above negative for any acute fracture but does note tissue swelling.  Good give her a ankle brace and prescription for naproxen.  Gave her information for ice, compression, elevation.  Have her follow-up with primary care provider as needed or with orthopedics if no improvement within 5 to 7 days.  Final Clinical Impressions(s) / UC Diagnoses   Final diagnoses:  Acute right ankle pain  Sprain of right ankle,  unspecified ligament, initial encounter     Discharge Instructions     -Naproxen: 1 tablet twice a day for pain and swelling -Ice/cold compresses, elevation as attached. -Follow with primary care provider as needed -Follow-up with orthopedics if no improvement in 5 to 7 days.     ED Prescriptions  Medication Sig Dispense Auth. Provider   naproxen (NAPROSYN) 500 MG tablet Take 1 tablet (500 mg total) by mouth 2 (two) times daily. 30 tablet Luvenia Redden, PA-C     Controlled Substance Prescriptions [place] Controlled Substance Registry consulted? Not Applicable   Luvenia Redden, PA-C 01/19/18 1937    Luvenia Redden, PA-C 01/19/18 [phone]

## 2018-01-19 NOTE — Discharge Instructions (Signed)
-  Naproxen: 1 tablet twice a day for pain and swelling -Ice/cold compresses, elevation as attached. -Follow with primary care provider as needed -Follow-up with orthopedics if no improvement in 5 to 7 days.

## 2018-03-02 ENCOUNTER — Telehealth: Payer: Self-pay | Admitting: Family Medicine

## 2018-03-02 DIAGNOSIS — I10 Essential (primary) hypertension: Secondary | ICD-10-CM

## 2018-03-03 NOTE — Telephone Encounter (Signed)
Patient has not been seen in over a year. Please call the patient to set up a follow-up appointment with me or Lauren. She will not be able to have additional refills until she follows up and has labs.

## 2018-03-04 NOTE — Telephone Encounter (Signed)
Sent to [facility] please schedule patient for an appt.   Thanks

## 2018-03-31 NOTE — Telephone Encounter (Signed)
FYI, Pt was called several times since 03/04/2018 and left several messages to call ofc to schedule appt. No return call. How do you want me to proceed? Please advise? Thank you!

## 2018-03-31 NOTE — Telephone Encounter (Signed)
Will wait for patient to return call will mail UTR letter.

## 2018-04-03 ENCOUNTER — Other Ambulatory Visit: Payer: Self-pay | Admitting: Family Medicine

## 2018-04-03 DIAGNOSIS — I10 Essential (primary) hypertension: Secondary | ICD-10-CM

## 2018-04-22 NOTE — Telephone Encounter (Signed)
I spoke with pt, pt is scheduled for 12/2 at 3:30 pm.

## 2018-04-22 NOTE — Telephone Encounter (Signed)
Just a FYI

## 2018-04-22 NOTE — Telephone Encounter (Signed)
Patient called and stated that [facility] called her and advised she needs to be seen before her appointment on June 09, 2018. I tried to move the appt, and that was the next available. Please call patient.

## 2018-04-22 NOTE — Telephone Encounter (Signed)
Please advise 

## 2018-04-27 ENCOUNTER — Ambulatory Visit: Payer: BC Managed Care – PPO | Admitting: Family Medicine

## 2018-04-27 VITALS — BP 134/76 | HR 76 | Temp 98.0°F | Wt 181.2 lb

## 2018-04-27 DIAGNOSIS — E8801 Alpha-1-antitrypsin deficiency: Secondary | ICD-10-CM | POA: Diagnosis not present

## 2018-04-27 DIAGNOSIS — Z1239 Encounter for other screening for malignant neoplasm of breast: Secondary | ICD-10-CM

## 2018-04-27 DIAGNOSIS — I10 Essential (primary) hypertension: Secondary | ICD-10-CM | POA: Diagnosis not present

## 2018-04-27 DIAGNOSIS — K760 Fatty (change of) liver, not elsewhere classified: Secondary | ICD-10-CM

## 2018-04-27 NOTE — Assessment & Plan Note (Signed)
Refer back to GI. 

## 2018-04-27 NOTE — Assessment & Plan Note (Signed)
Refer back to GI for follow-up. 

## 2018-04-27 NOTE — Assessment & Plan Note (Signed)
Labs as outlined below.  Once these return we will refill her HCTZ.

## 2018-04-27 NOTE — Progress Notes (Signed)
  Tommi Rumps, MD Phone: [phone]  Barbara Ortega is a 42 y.o. female who presents today for follow-up.  CC: Hypertension, fatty liver  Hypertension: Typically 120-130/70s-80s.  Taking HCTZ.  No chest pain, shortness of breath, or edema.  Fatty liver/alpha-1 antitrypsin deficiency: Patient has been following with liver specialist at [facility].  She due for follow-up.  She notes no abdominal pain.  Social History   Tobacco Use  Smoking Status Never Smoker  Smokeless Tobacco Never Used     ROS see history of present illness  Objective  Physical Exam Vitals:   04/27/18 1600  BP: 134/76  Pulse: 76  Temp: 98 F (36.7 C)  SpO2: 96%    BP Readings from Last 3 Encounters:  04/27/18 134/76  01/19/18 135/84  06/28/17 140/90   Wt Readings from Last 3 Encounters:  04/27/18 181 lb 3.2 oz (82.2 kg)  01/19/18 180 lb (81.6 kg)  06/28/17 180 lb (81.6 kg)    Physical Exam  Constitutional: No distress.  Cardiovascular: Normal rate, regular rhythm and normal heart sounds.  Pulmonary/Chest: Effort normal and breath sounds normal.  Musculoskeletal: She exhibits no edema.  Neurological: She is alert.  Skin: Skin is warm and dry. She is not diaphoretic.     Assessment/Plan: Please see individual problem list.  Essential hypertension, benign Labs as outlined below.  Once these return we will refill her HCTZ.  Alpha-1-antitrypsin deficiency Refer back to GI.  Fatty infiltration of liver Refer back to GI for follow-up.   Health Maintenance: Screening mammogram ordered.  Prior mammogram result and biopsy reviewed.  Benign findings.  Patient was advised at the time of the biopsy results to resume screening mammograms.  Orders Placed This Encounter  Procedures  . MM 3D SCREEN BREAST BILATERAL    Standing Status:   Future    Standing Expiration Date:   06/29/2019    Order Specific Question:   Reason for Exam (SYMPTOM  OR DIAGNOSIS REQUIRED)    Answer:   breast cancer  screening    Order Specific Question:   Is the patient pregnant?    Answer:   No    Order Specific Question:   Preferred imaging location?    Answer:   [facility]  . Comp Met (CMET)  . Lipid panel  . Ambulatory referral to Gastroenterology    Referral Priority:   Routine    Referral Type:   Consultation    Referral Reason:   Specialty Services Required    Number of Visits Requested:   1    No orders of the defined types were placed in this encounter.    Tommi Rumps, MD [place]

## 2018-04-27 NOTE — Patient Instructions (Signed)
Nice to see you. We will get lab work today and contact you with results. Please call to schedule your follow-up mammogram.

## 2018-04-28 LAB — COMPREHENSIVE METABOLIC PANEL
ALBUMIN: 4.7 g/dL (ref 3.5–5.2)
ALT: 23 U/L (ref 0–35)
AST: 16 U/L (ref 0–37)
Alkaline Phosphatase: 51 U/L (ref 39–117)
BUN: 16 mg/dL (ref 6–23)
CHLORIDE: 101 meq/L (ref 96–112)
CO2: 27 mEq/L (ref 19–32)
Calcium: 9.8 mg/dL (ref 8.4–10.5)
Creatinine, Ser: 1.13 mg/dL (ref 0.40–1.20)
GFR: 55.8 mL/min — AB (ref >60.00)
GLUCOSE: 87 mg/dL (ref 70–99)
POTASSIUM: 3.6 meq/L (ref 3.5–5.1)
SODIUM: 137 meq/L (ref 135–145)
Total Bilirubin: 0.4 mg/dL (ref 0.2–1.2)
Total Protein: 7.8 g/dL (ref 6.0–8.3)

## 2018-04-28 LAB — LIPID PANEL
CHOL/HDL RATIO: 5
CHOLESTEROL: 178 mg/dL (ref 0–200)
HDL: 33.2 mg/dL — AB (ref >39.00)
NonHDL: 144.4
Triglycerides: 337 mg/dL — ABNORMAL HIGH (ref 0.0–149.0)
VLDL: 67.4 mg/dL — AB (ref 0.0–40.0)

## 2018-04-28 LAB — LDL CHOLESTEROL, DIRECT: Direct LDL: 119 mg/dL

## 2018-04-30 ENCOUNTER — Other Ambulatory Visit: Payer: Self-pay | Admitting: Family Medicine

## 2018-04-30 DIAGNOSIS — N179 Acute kidney failure, unspecified: Secondary | ICD-10-CM

## 2018-05-03 ENCOUNTER — Other Ambulatory Visit: Payer: Self-pay | Admitting: Family Medicine

## 2018-05-03 DIAGNOSIS — I10 Essential (primary) hypertension: Secondary | ICD-10-CM

## 2018-05-07 ENCOUNTER — Other Ambulatory Visit: Payer: Self-pay | Admitting: Family Medicine

## 2018-05-07 DIAGNOSIS — I10 Essential (primary) hypertension: Secondary | ICD-10-CM

## 2018-05-25 ENCOUNTER — Other Ambulatory Visit (INDEPENDENT_AMBULATORY_CARE_PROVIDER_SITE_OTHER): Payer: BC Managed Care – PPO

## 2018-05-25 DIAGNOSIS — N179 Acute kidney failure, unspecified: Secondary | ICD-10-CM

## 2018-05-25 LAB — BASIC METABOLIC PANEL
BUN: 13 mg/dL (ref 6–23)
CALCIUM: 9 mg/dL (ref 8.4–10.5)
CHLORIDE: 105 meq/L (ref 96–112)
CO2: 26 mEq/L (ref 19–32)
CREATININE: 0.87 mg/dL (ref 0.40–1.20)
GFR: 75.43 mL/min (ref >60.00)
GLUCOSE: 92 mg/dL (ref 70–99)
Potassium: 3.8 mEq/L (ref 3.5–5.1)
Sodium: 137 mEq/L (ref 135–145)

## 2018-06-09 ENCOUNTER — Ambulatory Visit: Payer: BC Managed Care – PPO | Admitting: Family Medicine

## 2018-06-15 ENCOUNTER — Encounter: Payer: Self-pay | Admitting: Family Medicine

## 2018-07-15 DIAGNOSIS — I10 Essential (primary) hypertension: Secondary | ICD-10-CM | POA: Insufficient documentation

## 2018-07-16 ENCOUNTER — Ambulatory Visit: Payer: BC Managed Care – PPO

## 2018-08-07 ENCOUNTER — Ambulatory Visit: Payer: BC Managed Care – PPO

## 2018-08-11 ENCOUNTER — Other Ambulatory Visit: Payer: Self-pay | Admitting: Family Medicine

## 2018-08-11 DIAGNOSIS — I10 Essential (primary) hypertension: Secondary | ICD-10-CM

## 2018-09-01 ENCOUNTER — Ambulatory Visit: Payer: BC Managed Care – PPO

## 2018-09-18 ENCOUNTER — Ambulatory Visit (INDEPENDENT_AMBULATORY_CARE_PROVIDER_SITE_OTHER): Payer: BC Managed Care – PPO | Admitting: Family Medicine

## 2018-09-18 ENCOUNTER — Other Ambulatory Visit: Payer: Self-pay

## 2018-09-18 DIAGNOSIS — J208 Acute bronchitis due to other specified organisms: Secondary | ICD-10-CM

## 2018-09-18 DIAGNOSIS — J302 Other seasonal allergic rhinitis: Secondary | ICD-10-CM

## 2018-09-18 MED ORDER — HYDROCOD POLST-CPM POLST ER 10-8 MG/5ML PO SUER: 5.0000 mL | Freq: Two times a day (BID) | ORAL | 0 refills | Status: DC | PRN

## 2018-09-18 NOTE — Progress Notes (Signed)
Patient ID: Barbara Ortega, female   DOB: May 24, 1976, 43 y.o.   MRN: [id number]  Virtual Visit via video Note  This visit type was conducted due to national recommendations for restrictions regarding the COVID-19 pandemic (e.g. social distancing).  This format is felt to be most appropriate for this patient at this time.  All issues noted in this document were discussed and addressed.  No physical exam was performed (except for noted visual exam findings with Video Visits).   I connected with [name] on 09/18/18 at  9:00 AM EDT by a video enabled telemedicine application and verified that I am speaking with the correct person using two identifiers. Location patient: home Location provider: [facility] Persons participating in the virtual visit: patient, provider  I discussed the limitations, risks, security and privacy concerns of performing an evaluation and management service by video and the availability of in person appointments. I also discussed with the patient that there may be a patient responsible charge related to this service. The patient expressed understanding and agreed to proceed.  HPI:  Patient and I connected via video due to complaint of dry annoying cough that has been present for approximately 10 days.  States the main reason she wanted appointment today was due to the cough keeping her up at night.  Cough is nonproductive.  Feels wheezy or short of breath.  Feels like she is constantly clearing throat from postnasal drainage.  Does have history of seasonal allergies.  Take allergy medication every day. No fever, chills or body aches. No chest pain. No GI or GU issues. Does not feel wheezing, but sometimes she coughs so hard when laying down, feels tightness. Usually once coughing stops, tightness resolves.   Patient has been remaining home as much as possible throughout the pandemic, does go to the grocery store on occasion but has not been to the grocery store in about 2  weeks.  Denies any known exposure to someone under investigation for or who has tested positive for COVID-19 virus.    ROS: See pertinent positives and negatives per HPI.  Past Medical History:  Diagnosis Date  . Hypertension   . Migraine   . Sinus disease     Past Surgical History:  Procedure Laterality Date  . BREAST BIOPSY Left    Benign  . NASAL SINUS SURGERY  2011   Dr. Kathyrn Sheriff  . VAGINAL DELIVERY     1  . WISDOM TOOTH EXTRACTION      Family History  Problem Relation Age of Onset  . Hypertension Mother   . Cancer Father        lung  . Breast cancer Maternal Aunt    Social History   Tobacco Use  . Smoking status: Never Smoker  . Smokeless tobacco: Never Used  Substance Use Topics  . Alcohol use: No    Alcohol/week: 0.0 standard drinks    Current Outpatient Medications:  .  cetirizine (ZYRTEC) 10 MG tablet, Take 10 mg by mouth daily., Disp: , Rfl:  .  fluticasone (FLONASE) 50 MCG/ACT nasal spray, Place 2 sprays into both nostrils daily., Disp: 16 g, Rfl: 0 .  hydrochlorothiazide (MICROZIDE) 12.5 MG capsule, TAKE ONE CAPSULE BY MOUTH DAILY., Disp: 30 capsule, Rfl: 0 .  hydrochlorothiazide (MICROZIDE) 12.5 MG capsule, TAKE 1 CAPSULE(12.5 MG) BY MOUTH DAILY, Disp: 90 capsule, Rfl: 0 .  ibuprofen (ADVIL,MOTRIN) 800 MG tablet, Take 1 tablet (800 mg total) by mouth every 8 (eight) hours as needed., Disp: 30  tablet, Rfl: 0 .  levonorgestrel (MIRENA) 20 MCG/24HR IUD, 1 each by Intrauterine route once., Disp: , Rfl:  .  SUMAtriptan (IMITREX) 50 MG tablet, Take 1 tablet (50 mg total) by mouth once as needed for migraine. May repeat in 2 hours if headache persists or recurs. Do not exceed 2 tablets in 24 hour time period., Disp: 10 tablet, Rfl: 11  EXAM:  GENERAL: alert, oriented, appears well and in no acute distress  HEENT: atraumatic, conjunttiva clear, no obvious abnormalities on inspection of external nose and ears  NECK: normal movements of the head and  neck  LUNGS: on inspection no signs of respiratory distress, breathing rate appears normal, no obvious gross SOB, gasping or wheezing.  CV: no obvious cyanosis  MS: moves all visible extremities without noticeable abnormality  PSYCH/NEURO: pleasant and cooperative, no obvious depression or anxiety, speech and thought processing grossly intact  ASSESSMENT AND PLAN:  Discussed the following assessment and plan:  Viral bronchitis - Plan: chlorpheniramine-HYDROcodone (TUSSIONEX PENNKINETIC ER) 10-8 MG/5ML SUER  Seasonal allergies   Suspect patient's symptoms are most likely related to a viral bronchitis and/or seasonal allergy exacerbation.  Patient will use Mucinex tablets during the day to call cough, and use Tussionex cough syrup at night to allow self to get some rest and reduce cough.  Patient advised that Tussionex cough syrup does cause drowsiness, so do not take this prior to driving a car. Declines Rx for an inhaler to be sent in.  Discussed with patient that potentially her symptoms could be from the COVID-19 virus, currently she is at day 10 of symptoms so she has been advised to remain home for the next 4 days and monitor self for any worsening of symptoms including development of shortness of breath or high fever.  Encouraged to do diligent handwashing & when she does leave the home, to wear a mask.   I discussed the assessment and treatment plan with the patient. The patient was provided an opportunity to ask questions and all were answered. The patient agreed with the plan and demonstrated an understanding of the instructions.   The patient was advised to call back or seek an in-person evaluation if the symptoms worsen or if the condition fails to improve as anticipated.   Jodelle Green, FNP

## 2018-09-29 ENCOUNTER — Encounter: Payer: Self-pay | Admitting: Family Medicine

## 2018-09-29 DIAGNOSIS — R058 Other specified cough: Secondary | ICD-10-CM

## 2018-09-29 DIAGNOSIS — R05 Cough: Secondary | ICD-10-CM

## 2018-09-30 MED ORDER — AZITHROMYCIN 250 MG PO TABS: ORAL_TABLET | ORAL | 0 refills | Status: DC

## 2018-10-21 ENCOUNTER — Ambulatory Visit
Admission: RE | Admit: 2018-10-21 | Discharge: 2018-10-21 | Disposition: A | Discharge status: Home / Self Care | Payer: BC Managed Care – PPO | Source: Ambulatory Visit | Attending: Family Medicine | Admitting: Family Medicine

## 2018-10-21 ENCOUNTER — Other Ambulatory Visit: Payer: Self-pay

## 2018-10-21 DIAGNOSIS — Z1239 Encounter for other screening for malignant neoplasm of breast: Secondary | ICD-10-CM

## 2018-10-26 ENCOUNTER — Ambulatory Visit (INDEPENDENT_AMBULATORY_CARE_PROVIDER_SITE_OTHER): Payer: BC Managed Care – PPO | Admitting: Family Medicine

## 2018-10-26 ENCOUNTER — Other Ambulatory Visit: Payer: Self-pay

## 2018-10-26 ENCOUNTER — Ambulatory Visit: Payer: BC Managed Care – PPO | Admitting: Family Medicine

## 2018-10-26 ENCOUNTER — Encounter: Payer: Self-pay | Admitting: Family Medicine

## 2018-10-26 DIAGNOSIS — R05 Cough: Secondary | ICD-10-CM

## 2018-10-26 DIAGNOSIS — G44209 Tension-type headache, unspecified, not intractable: Secondary | ICD-10-CM

## 2018-10-26 DIAGNOSIS — E781 Pure hyperglyceridemia: Secondary | ICD-10-CM | POA: Insufficient documentation

## 2018-10-26 DIAGNOSIS — I10 Essential (primary) hypertension: Secondary | ICD-10-CM

## 2018-10-26 DIAGNOSIS — R059 Cough, unspecified: Secondary | ICD-10-CM

## 2018-10-26 NOTE — Assessment & Plan Note (Signed)
Previously well controlled.  She will continue her current regimen.  We will plan to see her in the office in about 4 months for follow-up.

## 2018-10-26 NOTE — Assessment & Plan Note (Signed)
Discussed adding an exercise with walking 2 to 3 days a week to start with.  Discussed eating a healthy lunch and avoiding snack foods.

## 2018-10-26 NOTE — Progress Notes (Signed)
Virtual Visit via video Note  This visit type was conducted due to national recommendations for restrictions regarding the COVID-19 pandemic (e.g. social distancing).  This format is felt to be most appropriate for this patient at this time.  All issues noted in this document were discussed and addressed.  No physical exam was performed (except for noted visual exam findings with Video Visits).   I connected with [name] today at  3:30 PM EDT by a video enabled telemedicine application and verified that I am speaking with the correct person using two identifiers. Location patient: home Location provider: work Persons participating in the virtual visit: patient, provider  I discussed the limitations, risks, security and privacy concerns of performing an evaluation and management service by telephone and the availability of in person appointments. I also discussed with the patient that there may be a patient responsible charge related to this service. The patient expressed understanding and agreed to proceed.   Reason for visit: follow-up  HPI: Hypertension: Not checking blood pressures.  She is taking HCTZ.  No chest pain or edema.  No shortness of breath prior to her recent respiratory illness.  Cough: Patient was seen in April for likely viral bronchitis.  She feels she may have had COVID-19 at that time.  It took her a good 4 weeks to start to improve.  She notes her cough has been improving though she does continue to have some cough.  No fevers.  She does get a little short winded when she goes out to be active though that is improving as well.  She notes no hemoptysis.  Wheezing resolved about a week ago.  No COVID-19 exposure.  No recent travel.  Overall improving.  Headaches: She reports dull tension-like headaches above her eyes that occur intermittently.  She notes they are not consistent with her migraines.  No numbness, weakness, or vision changes.  She does have a history of  tension headaches.  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  There is some stress related to that as well.  Hypertriglyceridemia: She has not been exercising very much with the COVID-19 pandemic and her recent illness.  She does snack a lot for lunch.  She will have bagels or cereal for breakfast.  Dinner typically consists of meat and vegetables.   ROS: See pertinent positives and negatives per HPI.  Past Medical History:  Diagnosis Date  . Hypertension   . Migraine   . Sinus disease     Past Surgical History:  Procedure Laterality Date  . BREAST BIOPSY Right 2019   Benign  . BREAST BIOPSY Left 2012   benign  . NASAL SINUS SURGERY  2011   Dr. Kathyrn Sheriff  . VAGINAL DELIVERY     1  . WISDOM TOOTH EXTRACTION      Family History  Problem Relation Age of Onset  . Hypertension Mother   . Cancer Father        lung  . Breast cancer Maternal Aunt     SOCIAL HX: Non-smoker.   Current Outpatient Medications:  .  azithromycin (ZITHROMAX) 250 MG tablet, Take 2 tablets on day 1 and take 1 tablet on days 2-5, Disp: 6 tablet, Rfl: 0 .  cetirizine (ZYRTEC) 10 MG tablet, Take 10 mg by mouth daily., Disp: , Rfl:  .  chlorpheniramine-HYDROcodone (TUSSIONEX PENNKINETIC ER) 10-8 MG/5ML SUER, Take 5 mLs by mouth every 12 (twelve) hours as needed., Disp:  240 mL, Rfl: 0 .  fluticasone (FLONASE) 50 MCG/ACT nasal spray, Place 2 sprays into both nostrils daily., Disp: 16 g, Rfl: 0 .  hydrochlorothiazide (MICROZIDE) 12.5 MG capsule, TAKE ONE CAPSULE BY MOUTH DAILY., Disp: 30 capsule, Rfl: 0 .  hydrochlorothiazide (MICROZIDE) 12.5 MG capsule, TAKE 1 CAPSULE(12.5 MG) BY MOUTH DAILY, Disp: 90 capsule, Rfl: 0 .  ibuprofen (ADVIL,MOTRIN) 800 MG tablet, Take 1 tablet (800 mg total) by mouth every 8 (eight) hours as needed., Disp: 30 tablet, Rfl: 0 .  levonorgestrel (MIRENA) 20 MCG/24HR IUD, 1 each by Intrauterine route once., Disp: , Rfl:  .  SUMAtriptan (IMITREX)  50 MG tablet, Take 1 tablet (50 mg total) by mouth once as needed for migraine. May repeat in 2 hours if headache persists or recurs. Do not exceed 2 tablets in 24 hour time period., Disp: 10 tablet, Rfl: 11  EXAM:  VITALS per patient if applicable: None.  GENERAL: alert, oriented, appears well and in no acute distress  HEENT: atraumatic, conjunttiva clear, no obvious abnormalities on inspection of external nose and ears  NECK: normal movements of the head and neck  LUNGS: on inspection no signs of respiratory distress, breathing rate appears normal, no obvious gross SOB, gasping or wheezing  CV: no obvious cyanosis  MS: moves all visible extremities without noticeable abnormality  PSYCH/NEURO: pleasant and cooperative, no obvious depression or anxiety, speech and thought processing grossly intact  ASSESSMENT AND PLAN:  Discussed the following assessment and plan:  Essential hypertension, benign  Tension headache  Cough  Hypertriglyceridemia  Essential hypertension, benign Previously well controlled.  She will continue her current regimen.  We will plan to see her in the office in about 4 months for follow-up.  Tension headache Headaches are consistent with tension headaches.  She does have a history of these.  She will monitor.  Discussed increasing some activity to help with stress reduction to help with this.  Cough I suspect this is related to post viral illness cough.  Has been improving.  Discussed monitoring for the next 1 to 2 weeks and if not continuing to improve she will contact us so we can arrange for a chest x-ray.  Given return precautions.  Hypertriglyceridemia Discussed adding an exercise with walking 2 to 3 days a week to start with.  Discussed eating a healthy lunch and avoiding snack foods.  [place] office staff will contact the patient to schedule follow-up in 4 months.  Social distancing precautions and sick precautions given regarding COVID-19.   I  discussed the assessment and treatment plan with the patient. The patient was provided an opportunity to ask questions and all were answered. The patient agreed with the plan and demonstrated an understanding of the instructions.   The patient was advised to call back or seek an in-person evaluation if the symptoms worsen or if the condition fails to improve as anticipated.   Tommi Rumps, MD

## 2018-10-26 NOTE — Assessment & Plan Note (Signed)
Headaches are consistent with tension headaches.  She does have a history of these.  She will monitor.  Discussed increasing some activity to help with stress reduction to help with this.

## 2018-10-26 NOTE — Assessment & Plan Note (Signed)
I suspect this is related to post viral illness cough.  Has been improving.  Discussed monitoring for the next 1 to 2 weeks and if not continuing to improve she will contact us so we can arrange for a chest x-ray.  Given return precautions.

## 2018-11-02 ENCOUNTER — Other Ambulatory Visit: Payer: Self-pay | Admitting: Obstetrics and Gynecology

## 2018-11-02 DIAGNOSIS — Z9189 Other specified personal risk factors, not elsewhere classified: Secondary | ICD-10-CM

## 2018-11-02 DIAGNOSIS — Z803 Family history of malignant neoplasm of breast: Secondary | ICD-10-CM

## 2018-11-24 ENCOUNTER — Ambulatory Visit
Admission: RE | Admit: 2018-11-24 | Discharge: 2018-11-24 | Disposition: A | Discharge status: Home / Self Care | Payer: BC Managed Care – PPO | Source: Ambulatory Visit | Attending: Obstetrics and Gynecology | Admitting: Obstetrics and Gynecology

## 2018-11-24 DIAGNOSIS — Z803 Family history of malignant neoplasm of breast: Secondary | ICD-10-CM

## 2018-11-24 DIAGNOSIS — Z9189 Other specified personal risk factors, not elsewhere classified: Secondary | ICD-10-CM

## 2018-11-24 MED ORDER — GADOBUTROL 1 MMOL/ML IV SOLN
8.0000 mL | Freq: Once | INTRAVENOUS | Status: AC | PRN
  Administered 2018-11-24: 8 mL via INTRAVENOUS

## 2018-12-21 ENCOUNTER — Other Ambulatory Visit: Payer: Self-pay | Admitting: Family Medicine

## 2018-12-21 DIAGNOSIS — I10 Essential (primary) hypertension: Secondary | ICD-10-CM

## 2019-01-22 ENCOUNTER — Ambulatory Visit (INDEPENDENT_AMBULATORY_CARE_PROVIDER_SITE_OTHER): Payer: BC Managed Care – PPO | Admitting: Family Medicine

## 2019-01-22 ENCOUNTER — Other Ambulatory Visit: Payer: Self-pay

## 2019-01-22 DIAGNOSIS — J019 Acute sinusitis, unspecified: Secondary | ICD-10-CM | POA: Diagnosis not present

## 2019-01-22 MED ORDER — AMOXICILLIN-POT CLAVULANATE 875-125 MG PO TABS: 1.0000 | ORAL_TABLET | Freq: Two times a day (BID) | ORAL | 0 refills | Status: DC

## 2019-01-22 NOTE — Progress Notes (Signed)
Patient ID: Barbara Ortega, female   DOB: 07/25/1975, 43 y.o.   MRN: [id number]    Virtual Visit via video Note  This visit type was conducted due to national recommendations for restrictions regarding the COVID-19 pandemic (e.g. social distancing).  This format is felt to be most appropriate for this patient at this time.  All issues noted in this document were discussed and addressed.  No physical exam was performed (except for noted visual exam findings with Video Visits).   I connected with Barbara Ortega today at  2:00 PM EDT by a video enabled telemedicine application and verified that I am speaking with the correct person using two identifiers. Location patient: home Location provider: work or home office Persons participating in the virtual visit: patient, provider  I discussed the limitations, risks, security and privacy concerns of performing an evaluation and management service by video and the availability of in person appointments. I also discussed with the patient that there may be a patient responsible charge related to this service. The patient expressed understanding and agreed to proceed.   HPI:  Patient and I connected via video due to complaint of ear pain, sinus congestion that is been present for 2 weeks.  Patient states she is diligent with taking her Flonase and either a Zyrtec or Allegra to help combat seasonal allergy symptoms.  States when she blows her nasal passages thick and yellow and ears feel full and muffled at times.  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  As far she knows she has not been around anyone who has been suspected of or who has tested positive for COVID-19.  When she leaves home she is diligent with wearing mask and handwashing.  Denies any fever or chills, denies cough, shortness of breath or wheezing.  Denies any nausea, vomiting or diarrhea.  Denies chest pain.    ROS: See pertinent positives and negatives per HPI.   Past Medical History:  Diagnosis Date  . Hypertension   . Migraine   . Sinus disease     Past Surgical History:  Procedure Laterality Date  . BREAST BIOPSY Right 2019   Benign  . BREAST BIOPSY Left 2012   benign  . NASAL SINUS SURGERY  2011   Dr. Kathyrn Sheriff  . VAGINAL DELIVERY     1  . WISDOM TOOTH EXTRACTION      Family History  Problem Relation Age of Onset  . Hypertension Mother   . Cancer Father        lung  . Breast cancer Maternal Aunt    Social History   Tobacco Use  . Smoking status: Never Smoker  . Smokeless tobacco: Never Used  Substance Use Topics  . Alcohol use: No    Alcohol/week: 0.0 standard drinks    Current Outpatient Medications:  .  azithromycin (ZITHROMAX) 250 MG tablet, Take 2 tablets on day 1 and take 1 tablet on days 2-5, Disp: 6 tablet, Rfl: 0 .  cetirizine (ZYRTEC) 10 MG tablet, Take 10 mg by mouth daily., Disp: , Rfl:  .  chlorpheniramine-HYDROcodone (TUSSIONEX PENNKINETIC ER) 10-8 MG/5ML SUER, Take 5 mLs by mouth every 12 (twelve) hours as needed., Disp: 240 mL, Rfl: 0 .  fluticasone (FLONASE) 50 MCG/ACT nasal spray, Place 2 sprays into both nostrils daily., Disp: 16 g, Rfl: 0 .  hydrochlorothiazide (MICROZIDE) 12.5 MG capsule, TAKE 1 CAPSULE(12.5 MG) BY MOUTH DAILY, Disp: 90 capsule, Rfl: 0 .  hydrochlorothiazide (MICROZIDE) 12.5 MG capsule, TAKE  ONE CAPSULE BY MOUTH DAILY., Disp: 30 capsule, Rfl: 0 .  ibuprofen (ADVIL,MOTRIN) 800 MG tablet, Take 1 tablet (800 mg total) by mouth every 8 (eight) hours as needed., Disp: 30 tablet, Rfl: 0 .  levonorgestrel (MIRENA) 20 MCG/24HR IUD, 1 each by Intrauterine route once., Disp: , Rfl:  .  SUMAtriptan (IMITREX) 50 MG tablet, Take 1 tablet (50 mg total) by mouth once as needed for migraine. May repeat in 2 hours if headache persists or recurs. Do not exceed 2 tablets in 24 hour time period., Disp: 10 tablet, Rfl: 11 .  amoxicillin-clavulanate (AUGMENTIN) 875-125 MG tablet, Take 1 tablet by mouth 2 (two)  times daily., Disp: 20 tablet, Rfl: 0  EXAM:  GENERAL: alert, oriented, appears well and in no acute distress  HEENT: atraumatic, conjunttiva clear, no obvious abnormalities on inspection of external nose and ears  NECK: normal movements of the head and neck  LUNGS: on inspection no signs of respiratory distress, breathing rate appears normal, no obvious gross SOB, gasping or wheezing  CV: no obvious cyanosis  MS: moves all visible extremities without noticeable abnormality  PSYCH/NEURO: pleasant and cooperative, no obvious depression or anxiety, speech and thought processing grossly intact  ASSESSMENT AND PLAN:  Discussed the following assessment and plan:  Acute sinusitis - patient will take Augmentin twice daily to treat sinus infection.  She will need Flonase and oral antihistamine to help combat congestion and allergy symptoms.  Advised to do saline nasal rinses to really wash out sinuses.  Again recommended diligent handwashing and wearing a mask when leaving home. She declines COVID 19 testing.    I discussed the assessment and treatment plan with the patient. The patient was provided an opportunity to ask questions and all were answered. The patient agreed with the plan and demonstrated an understanding of the instructions.   The patient was advised to call back or seek an in-person evaluation if the symptoms worsen or if the condition fails to improve as anticipated.   Jodelle Green, FNP

## 2019-01-25 ENCOUNTER — Encounter: Payer: Self-pay | Admitting: Family Medicine

## 2019-02-09 ENCOUNTER — Other Ambulatory Visit: Payer: Self-pay | Admitting: Family Medicine

## 2019-02-09 DIAGNOSIS — I10 Essential (primary) hypertension: Secondary | ICD-10-CM

## 2019-02-24 ENCOUNTER — Other Ambulatory Visit: Payer: Self-pay

## 2019-02-24 DIAGNOSIS — Z20822 Contact with and (suspected) exposure to covid-19: Secondary | ICD-10-CM

## 2019-02-25 LAB — NOVEL CORONAVIRUS, NAA: SARS-CoV-2, NAA: NOT DETECTED

## 2019-03-09 ENCOUNTER — Other Ambulatory Visit: Payer: Self-pay | Admitting: Family Medicine

## 2019-03-09 DIAGNOSIS — I10 Essential (primary) hypertension: Secondary | ICD-10-CM

## 2019-03-12 MED ORDER — HYDROCHLOROTHIAZIDE 12.5 MG PO CAPS: 12.5000 mg | ORAL_CAPSULE | Freq: Every day | ORAL | 1 refills | Status: DC

## 2019-09-13 ENCOUNTER — Ambulatory Visit: Payer: BC Managed Care – PPO | Admitting: Dermatology

## 2019-09-28 ENCOUNTER — Ambulatory Visit: Payer: BC Managed Care – PPO | Admitting: Dermatology

## 2019-09-28 ENCOUNTER — Other Ambulatory Visit: Payer: Self-pay

## 2019-09-28 DIAGNOSIS — Z85828 Personal history of other malignant neoplasm of skin: Secondary | ICD-10-CM | POA: Diagnosis not present

## 2019-09-28 DIAGNOSIS — L814 Other melanin hyperpigmentation: Secondary | ICD-10-CM

## 2019-09-28 DIAGNOSIS — L820 Inflamed seborrheic keratosis: Secondary | ICD-10-CM

## 2019-09-28 DIAGNOSIS — D2371 Other benign neoplasm of skin of right lower limb, including hip: Secondary | ICD-10-CM | POA: Diagnosis not present

## 2019-09-28 DIAGNOSIS — D239 Other benign neoplasm of skin, unspecified: Secondary | ICD-10-CM

## 2019-09-28 DIAGNOSIS — D229 Melanocytic nevi, unspecified: Secondary | ICD-10-CM

## 2019-09-28 DIAGNOSIS — I781 Nevus, non-neoplastic: Secondary | ICD-10-CM

## 2019-09-28 DIAGNOSIS — D225 Melanocytic nevi of trunk: Secondary | ICD-10-CM | POA: Diagnosis not present

## 2019-09-28 DIAGNOSIS — L578 Other skin changes due to chronic exposure to nonionizing radiation: Secondary | ICD-10-CM

## 2019-09-28 DIAGNOSIS — Z1283 Encounter for screening for malignant neoplasm of skin: Secondary | ICD-10-CM

## 2019-09-28 DIAGNOSIS — D1801 Hemangioma of skin and subcutaneous tissue: Secondary | ICD-10-CM

## 2019-09-28 DIAGNOSIS — L821 Other seborrheic keratosis: Secondary | ICD-10-CM

## 2019-09-28 NOTE — Patient Instructions (Addendum)
Recommend daily broad spectrum sunscreen SPF 30+ to sun-exposed areas, reapply every 2 hours as needed. Call for new or changing lesions.  Cryotherapy Aftercare  . Wash gently with soap and water everyday.   . Apply Vaseline and Band-Aid daily until healed.  

## 2019-09-28 NOTE — Progress Notes (Signed)
Follow-Up Visit   Subjective  Barbara Ortega is a 44 y.o. female who presents for the following: TBSE (has a spot on the back of her left shoulder for about 6 months, itches, is sensitive and on occasion does bleed.) and Hx of BCC (Left medial infraoccular bx proven 04/29/2008).      The following portions of the chart were reviewed this encounter and updated as appropriate:      Review of Systems:  No other skin or systemic complaints except as noted in HPI or Assessment and Plan.  Objective  Well appearing patient in no apparent distress; mood and affect are within normal limits.  A full examination was performed including scalp, head, eyes, ears, nose, lips, neck, chest, axillae, abdomen, back, buttocks, bilateral upper extremities, bilateral lower extremities, hands, feet, fingers, toes, fingernails, and toenails. All findings within normal limits unless otherwise noted below.  Objective  Right inferior knee: 51mm pink white firm nodule  Objective  Left Medial Infraoccular: Well healed scar with no evidence of recurrence.   Objective  Left Shoulder - Posterior: Erythematous keratotic or waxy stuck-on papule  Objective    Left Flank: 66mm speckled medium dark brown macule  Left Upper Back: 9 mm x 5 mm speckled brown macule  Left spinal  Lower Back: 4 mm brown macule   Assessment & Plan  Dermatofibroma Right inferior knee  Benign, observe.  Recurrent after shave removal  History of basal cell carcinoma (BCC) Left Medial Infraoccular  Clear. Observe for recurrence. Call clinic for new or changing lesions.  Recommend regular skin exams, daily broad-spectrum spf 30+ sunscreen use, and photoprotection.     Inflamed seborrheic keratosis Left Shoulder - Posterior  Cryotherapy Today Prior to procedure, discussed risks of blister formation, small wound, skin dyspigmentation, or rare scar following cryotherapy.    Destruction of lesion - Left Shoulder -  Posterior  Destruction method: cryotherapy   Informed consent: discussed and consent obtained   Lesion destroyed using liquid nitrogen: Yes   Region frozen until ice ball extended beyond lesion: Yes   Outcome: patient tolerated procedure well with no complications   Post-procedure details: wound care instructions given    Nevus (3) Left Flank; Left Upper Back; Left spinal  Lower Back  Benign-appearing.  Observation.  Call clinic for new or changing moles.  Recommend daily use of broad spectrum spf 30+ sunscreen to sun-exposed areas.   Skin cancer screening performed today.   Actinic Damage - diffuse scaly erythematous macules with underlying dyspigmentation - Recommend daily broad spectrum sunscreen SPF 30+ to sun-exposed areas, reapply every 2 hours as needed.  - Call for new or changing lesions.  Hemangiomas - Red papules - Discussed benign nature - Observe - Call for any changes  Melanocytic Nevi - Tan-brown and/or pink-flesh-colored symmetric macules and papules - Benign appearing on exam today - Observation - Call clinic for new or changing moles - Recommend daily use of broad spectrum spf 30+ sunscreen to sun-exposed areas.   Lentigines - Scattered tan macules - Discussed due to sun exposure - Benign, observe - Call for any changes  Seborrheic Keratoses - Stuck-on, waxy, tan-brown papules and plaques  - Discussed benign etiology and prognosis. - Observe - Call for any changes  Telangiectasia- legs - Dilated blood vessel c/w spider veins - Benign appearing on exam - Call for changes   Return in about 1 year (around 09/27/2020), or if symptoms worsen or fail to improve, for TBSE.  I, Donzetta Kohut,  CMA, am acting as scribe for Brendolyn Patty, MD .   Documentation: I have reviewed the above documentation for accuracy and completeness, and I agree with the above.  Brendolyn Patty, MD

## 2019-10-13 ENCOUNTER — Other Ambulatory Visit: Payer: Self-pay | Admitting: Family Medicine

## 2019-10-13 DIAGNOSIS — I10 Essential (primary) hypertension: Secondary | ICD-10-CM

## 2019-10-19 ENCOUNTER — Other Ambulatory Visit: Payer: Self-pay | Admitting: Family Medicine

## 2019-10-19 DIAGNOSIS — Z1231 Encounter for screening mammogram for malignant neoplasm of breast: Secondary | ICD-10-CM

## 2019-12-13 ENCOUNTER — Other Ambulatory Visit: Payer: Self-pay

## 2019-12-13 ENCOUNTER — Ambulatory Visit
Admission: RE | Admit: 2019-12-13 | Discharge: 2019-12-13 | Disposition: A | Discharge status: Home / Self Care | Payer: BC Managed Care – PPO | Source: Ambulatory Visit

## 2019-12-13 DIAGNOSIS — Z1231 Encounter for screening mammogram for malignant neoplasm of breast: Secondary | ICD-10-CM

## 2020-05-03 ENCOUNTER — Other Ambulatory Visit: Payer: Self-pay | Admitting: Family Medicine

## 2020-05-03 DIAGNOSIS — I10 Essential (primary) hypertension: Secondary | ICD-10-CM

## 2020-10-03 ENCOUNTER — Encounter: Payer: BC Managed Care – PPO | Admitting: Dermatology

## 2020-11-03 ENCOUNTER — Other Ambulatory Visit: Payer: Self-pay | Admitting: Family Medicine

## 2020-11-03 DIAGNOSIS — I10 Essential (primary) hypertension: Secondary | ICD-10-CM

## 2020-11-03 NOTE — Telephone Encounter (Signed)
Pt saw lauren in 2020. Not established here. Called to schedule pt. Unable to LM

## 2020-11-08 ENCOUNTER — Other Ambulatory Visit: Payer: Self-pay | Admitting: Family Medicine

## 2020-11-08 ENCOUNTER — Ambulatory Visit: Payer: BC Managed Care – PPO | Admitting: Dermatology

## 2020-11-08 ENCOUNTER — Encounter: Payer: Self-pay | Admitting: Dermatology

## 2020-11-08 ENCOUNTER — Other Ambulatory Visit: Payer: Self-pay

## 2020-11-08 ENCOUNTER — Telehealth: Payer: Self-pay | Admitting: Family Medicine

## 2020-11-08 DIAGNOSIS — D1800 Hemangioma unspecified site: Secondary | ICD-10-CM | POA: Diagnosis not present

## 2020-11-08 DIAGNOSIS — D229 Melanocytic nevi, unspecified: Secondary | ICD-10-CM

## 2020-11-08 DIAGNOSIS — Z85828 Personal history of other malignant neoplasm of skin: Secondary | ICD-10-CM

## 2020-11-08 DIAGNOSIS — L578 Other skin changes due to chronic exposure to nonionizing radiation: Secondary | ICD-10-CM

## 2020-11-08 DIAGNOSIS — D2371 Other benign neoplasm of skin of right lower limb, including hip: Secondary | ICD-10-CM

## 2020-11-08 DIAGNOSIS — L814 Other melanin hyperpigmentation: Secondary | ICD-10-CM

## 2020-11-08 DIAGNOSIS — Z1283 Encounter for screening for malignant neoplasm of skin: Secondary | ICD-10-CM | POA: Diagnosis not present

## 2020-11-08 DIAGNOSIS — Z1231 Encounter for screening mammogram for malignant neoplasm of breast: Secondary | ICD-10-CM

## 2020-11-08 DIAGNOSIS — Z86018 Personal history of other benign neoplasm: Secondary | ICD-10-CM | POA: Diagnosis not present

## 2020-11-08 DIAGNOSIS — D225 Melanocytic nevi of trunk: Secondary | ICD-10-CM

## 2020-11-08 DIAGNOSIS — L821 Other seborrheic keratosis: Secondary | ICD-10-CM

## 2020-11-08 DIAGNOSIS — L739 Follicular disorder, unspecified: Secondary | ICD-10-CM

## 2020-11-08 NOTE — Telephone Encounter (Signed)
I received a mammogram order to cosign for this patient.  She has not been seen in 2 years.  I will sign the order though she needs to be scheduled for follow-up.  Please contact her regarding this.

## 2020-11-08 NOTE — Telephone Encounter (Signed)
I called the patient and informed her that the mammogram was ordered and I scheduled her a follow up appointment because its been a while.while since patient has been seen. Juanetta Negash,cma

## 2020-11-08 NOTE — Progress Notes (Signed)
Follow-Up Visit   Subjective  Barbara Ortega is a 45 y.o. female who presents for the following: Annual Exam (Here for skin cancer screening. Full body. Hx BCC, HxDN. No particular areas of concern, per patient. ).    The following portions of the chart were reviewed this encounter and updated as appropriate:      Review of Systems: No other skin or systemic complaints except as noted in HPI or Assessment and Plan.   Objective  Well appearing patient in no apparent distress; mood and affect are within normal limits.  A full examination was performed including scalp, head, eyes, ears, nose, lips, neck, chest, axillae, abdomen, back, buttocks, bilateral upper extremities, bilateral lower extremities, hands, feet, fingers, toes, fingernails, and toenails. All findings within normal limits unless otherwise noted below.  Right Flank; Left Upper Back; Left spinal  Lower Back Right flank  89mm speckled medium dark brown macule  Left Upper Back: 9 mm x 5 mm speckled brown macule   Left sacral Lower Back: 4 mm brown macule  Right Shoulder - Posterior, left breast Edematous follicular pink papule  Assessment & Plan  Nevus Right Flank; Left Upper Back; Left spinal  Lower Back  Benign-appearing.  Observation.  Call clinic for new or changing moles.  Recommend daily use of broad spectrum spf 30+ sunscreen to sun-exposed areas.  ABCDEs of mole observation discussed.  RTC if any changes noted.    Folliculitis Right Shoulder - Posterior, left breast  Due to increase in exercise workouts  Watch lesion on left breast, make sure resolves.   Lentigines - Scattered tan macules - Due to sun exposure - Benign-appering, observe - Recommend daily broad spectrum sunscreen SPF 30+ to sun-exposed areas, reapply every 2 hours as needed. - Call for any changes  Seborrheic Keratoses - Stuck-on, waxy, tan-brown papules and/or plaques  - Benign-appearing - Discussed benign etiology and  prognosis. - Observe - Call for any changes  Melanocytic Nevi - Tan-brown and/or pink-flesh-colored symmetric macules and papules - Benign appearing on exam today - Observation - Call clinic for new or changing moles - Recommend daily use of broad spectrum spf 30+ sunscreen to sun-exposed areas.    Hemangiomas - Red papules - Discussed benign nature - Observe - Call for any changes  Actinic Damage - Chronic condition, secondary to cumulative UV/sun exposure - diffuse scaly erythematous macules with underlying dyspigmentation - Recommend daily broad spectrum sunscreen SPF 30+ to sun-exposed areas, reapply every 2 hours as needed.  - Staying in the shade or wearing long sleeves, sun glasses (UVA+UVB protection) and wide brim hats (4-inch brim around the entire circumference of the hat) are also recommended for sun protection.  - Call for new or changing lesions.  Skin cancer screening performed today.  History of Basal Cell Carcinoma of the Skin - No evidence of recurrence today t left medial infra-ocular - Recommend regular full body skin exams - Recommend daily broad spectrum sunscreen SPF 30+ to sun-exposed areas, reapply every 2 hours as needed.  - Call if any new or changing lesions are noted between office visits   History of Dysplastic Nevus - No evidence of recurrence today at L-3 distal toe, periungual.  - Recommend regular full body skin exams - Recommend daily broad spectrum sunscreen SPF 30+ to sun-exposed areas, reapply every 2 hours as needed.  - Call if any new or changing lesions are noted between office visits   Dermatofibroma (Hx of previous shave removal) - Firm pink/brown  papulenodule with dimple sign at right lateral knee. - Benign appearing - Call for any changes   Return in 1 year (on 11/08/2021) for TBSE, HxBCC, HxDN.  I, Emelia Salisbury, CMA, am acting as scribe for Brendolyn Patty, MD.   Documentation: I have reviewed the above documentation for  accuracy and completeness, and I agree with the above.  Brendolyn Patty MD

## 2020-11-08 NOTE — Patient Instructions (Addendum)

## 2020-11-17 ENCOUNTER — Other Ambulatory Visit: Payer: Self-pay

## 2020-11-17 ENCOUNTER — Ambulatory Visit: Payer: BC Managed Care – PPO | Admitting: Family Medicine

## 2020-11-17 ENCOUNTER — Encounter: Payer: Self-pay | Admitting: Family Medicine

## 2020-11-17 VITALS — BP 108/78 | HR 83 | Wt 187.4 lb

## 2020-11-17 DIAGNOSIS — E781 Pure hyperglyceridemia: Secondary | ICD-10-CM

## 2020-11-17 DIAGNOSIS — Z23 Encounter for immunization: Secondary | ICD-10-CM | POA: Diagnosis not present

## 2020-11-17 DIAGNOSIS — Z1211 Encounter for screening for malignant neoplasm of colon: Secondary | ICD-10-CM

## 2020-11-17 DIAGNOSIS — E669 Obesity, unspecified: Secondary | ICD-10-CM

## 2020-11-17 DIAGNOSIS — E66811 Obesity, class 1: Secondary | ICD-10-CM

## 2020-11-17 DIAGNOSIS — E8801 Alpha-1-antitrypsin deficiency: Secondary | ICD-10-CM | POA: Diagnosis not present

## 2020-11-17 DIAGNOSIS — I10 Essential (primary) hypertension: Secondary | ICD-10-CM | POA: Diagnosis not present

## 2020-11-17 LAB — HEMOGLOBIN A1C: Hgb A1c MFr Bld: 5.5 % (ref 4.6–6.5)

## 2020-11-17 LAB — LIPID PANEL
Cholesterol: 182 mg/dL (ref 0–200)
HDL: 34 mg/dL — ABNORMAL LOW (ref >39.00)
NonHDL: 148.37
Total CHOL/HDL Ratio: 5
Triglycerides: 253 mg/dL — ABNORMAL HIGH (ref 0.0–149.0)
VLDL: 50.6 mg/dL — ABNORMAL HIGH (ref 0.0–40.0)

## 2020-11-17 LAB — COMPREHENSIVE METABOLIC PANEL
ALT: 35 U/L (ref 0–35)
AST: 20 U/L (ref 0–37)
Albumin: 4.7 g/dL (ref 3.5–5.2)
Alkaline Phosphatase: 50 U/L (ref 39–117)
BUN: 16 mg/dL (ref 6–23)
CO2: 28 mEq/L (ref 19–32)
Calcium: 9.9 mg/dL (ref 8.4–10.5)
Chloride: 101 mEq/L (ref 96–112)
Creatinine, Ser: 0.81 mg/dL (ref 0.40–1.20)
GFR: 87.45 mL/min (ref >60.00)
Glucose, Bld: 80 mg/dL (ref 70–99)
Potassium: 4.1 mEq/L (ref 3.5–5.1)
Sodium: 138 mEq/L (ref 135–145)
Total Bilirubin: 0.6 mg/dL (ref 0.2–1.2)
Total Protein: 7.4 g/dL (ref 6.0–8.3)

## 2020-11-17 LAB — LDL CHOLESTEROL, DIRECT: Direct LDL: 120 mg/dL

## 2020-11-17 NOTE — Patient Instructions (Signed)
Nice to see you. We will contact you with your results and then likely increase the dose on your HCTZ.

## 2020-11-17 NOTE — Assessment & Plan Note (Signed)
Check lab work.  The patient notes she is goingthe to gym for exercise.  I encouraged her to continue with this.

## 2020-11-17 NOTE — Assessment & Plan Note (Addendum)
We will see what her liver enzymes reveal.  I encouraged the patient to contact [facility] to schedule follow-up for this.

## 2020-11-17 NOTE — Assessment & Plan Note (Signed)
Above goal.  We will check labs and likely increase HCTZ.  Follow-up in 3 months.

## 2020-11-17 NOTE — Progress Notes (Signed)
Tommi Rumps, MD Phone: [phone]  Barbara Ortega is a 45 y.o. female who presents today for f/u.  HYPERTENSION Disease Monitoring Home BP Monitoring 120/80-90 Chest pain- no    Dyspnea- no Medications Compliance-  taking HCTZ 12.5 mg once daily.  Edema- no   Social History   Tobacco Use  Smoking Status Never  Smokeless Tobacco Never    Current Outpatient Medications on File Prior to Visit  Medication Sig Dispense Refill   cetirizine (ZYRTEC) 10 MG tablet Take 10 mg by mouth daily.     hydrochlorothiazide (MICROZIDE) 12.5 MG capsule TAKE 1 CAPSULE(12.5 MG) BY MOUTH DAILY 90 capsule 0   levonorgestrel (MIRENA) 20 MCG/24HR IUD 1 each by Intrauterine route once.     No current facility-administered medications on file prior to visit.     ROS see history of present illness  Objective  Physical Exam Vitals:   11/17/20 1326  BP: 108/78  Pulse: 83  SpO2: 99%    BP Readings from Last 3 Encounters:  11/17/20 108/78  04/27/18 134/76  01/19/18 135/84   Wt Readings from Last 3 Encounters:  11/17/20 187 lb 6.4 oz (85 kg)  04/27/18 181 lb 3.2 oz (82.2 kg)  01/19/18 180 lb (81.6 kg)    Physical Exam Constitutional:      General: She is not in acute distress.    Appearance: She is not diaphoretic.  Cardiovascular:     Rate and Rhythm: Normal rate and regular rhythm.     Heart sounds: Normal heart sounds.  Pulmonary:     Effort: Pulmonary effort is normal.     Breath sounds: Normal breath sounds.  Musculoskeletal:     Right lower leg: No edema.     Left lower leg: No edema.  Skin:    General: Skin is warm and dry.  Neurological:     Mental Status: She is alert.     Assessment/Plan: Please see individual problem list.  Problem List Items Addressed This Visit     Alpha-1-antitrypsin deficiency ([place])    We will see what her liver enzymes reveal.  I encouraged the patient to contact [facility] to schedule follow-up for this.       Relevant Orders    Comp Met (CMET)   Essential hypertension, benign - Primary    Above goal.  We will check labs and likely increase HCTZ.  Follow-up in 3 months.       Relevant Orders   Comp Met (CMET)   Hypertriglyceridemia    Check lab work.  The patient notes she is goingthe to gym for exercise.  I encouraged her to continue with this.       Relevant Orders   Comp Met (CMET)   Lipid panel   Other Visit Diagnoses     Obesity (BMI 30.0-34.9)       Relevant Orders   HgB A1c   Colon cancer screening       Relevant Orders   Ambulatory referral to Gastroenterology   Need for tetanus booster       Relevant Orders   Td : Tetanus/diphtheria >7yo Preservative  free (Completed)        Health Maintenance: Td given today. Pap smear to be done by GYN. GI referral for a colonoscopy placed.   Return in about 3 months (around 02/17/2021).  This visit occurred during the SARS-CoV-2 public health emergency.  Safety protocols were in place, including screening questions prior to the visit, additional usage of staff  PPE, and extensive cleaning of exam room while observing appropriate contact time as indicated for disinfecting solutions.    Tommi Rumps, MD [place]

## 2020-11-23 ENCOUNTER — Telehealth: Payer: BC Managed Care – PPO | Admitting: Gastroenterology

## 2020-11-23 DIAGNOSIS — Z800 Family history of malignant neoplasm of digestive organs: Secondary | ICD-10-CM

## 2020-11-23 DIAGNOSIS — Z8371 Family history of colonic polyps: Secondary | ICD-10-CM

## 2020-11-23 MED ORDER — CLENPIQ 10-3.5-12 MG-GM -GM/160ML PO SOLN: 1.0000 | Freq: Once | ORAL | 0 refills | Status: DC

## 2020-11-23 NOTE — Progress Notes (Signed)
Gastroenterology Pre-Procedure Review  Request Date: 01/02/2021 Requesting Physician: Dr. Bonna Gains   PATIENT REVIEW QUESTIONS: The patient responded to the following health history questions as indicated:    1. Are you having any GI issues? no does have hemrrhoids  2. Do you have a personal history of Polyps?  N/A  3. Do you have a family history of Colon Cancer or Polyps? yes (Maternal Grandmother Colon Cnacer and polyps ) 4. Diabetes Mellitus? no 5. Joint replacements in the past 12 months?no 6. Major health problems in the past 3 months?no 7. Any artificial heart valves, MVP, or defibrillator?no    MEDICATIONS & ALLERGIES:    Patient reports the following regarding taking any anticoagulation/antiplatelet therapy:   Plavix, Coumadin, Eliquis, Xarelto, Lovenox, Pradaxa, Brilinta, or Effient? no Aspirin? no  Patient confirms/reports the following medications:  Current Outpatient Medications  Medication Sig Dispense Refill   cetirizine (ZYRTEC) 10 MG tablet Take 10 mg by mouth daily.     hydrochlorothiazide (MICROZIDE) 12.5 MG capsule TAKE 1 CAPSULE(12.5 MG) BY MOUTH DAILY 90 capsule 0   levonorgestrel (MIRENA) 20 MCG/24HR IUD 1 each by Intrauterine route once.     No current facility-administered medications for this visit.    Patient confirms/reports the following allergies:  Allergies  Allergen Reactions   Elemental Sulfur Hives    No orders of the defined types were placed in this encounter.   AUTHORIZATION INFORMATION Primary Insurance: 1D#: Group #:  Secondary Insurance: 1D#: Group #:  SCHEDULE INFORMATION: Date:  01/02/2021 Time: Location: [place]

## 2020-12-21 ENCOUNTER — Encounter: Payer: Self-pay | Admitting: Gastroenterology

## 2020-12-31 ENCOUNTER — Other Ambulatory Visit: Payer: Self-pay | Admitting: Gastroenterology

## 2020-12-31 DIAGNOSIS — Z800 Family history of malignant neoplasm of digestive organs: Secondary | ICD-10-CM

## 2020-12-31 DIAGNOSIS — Z8371 Family history of colonic polyps: Secondary | ICD-10-CM

## 2021-01-01 ENCOUNTER — Other Ambulatory Visit: Payer: Self-pay | Admitting: Gastroenterology

## 2021-01-01 ENCOUNTER — Other Ambulatory Visit: Payer: Self-pay

## 2021-01-01 MED ORDER — CLENPIQ 10-3.5-12 MG-GM -GM/160ML PO SOLN: 1.0000 | Freq: Once | ORAL | 0 refills | Status: DC

## 2021-01-01 NOTE — Progress Notes (Signed)
New order for prep has been sent to the pharmacy.

## 2021-01-01 NOTE — Anesthesia Preprocedure Evaluation (Addendum)
Anesthesia Evaluation  Patient identified by MRN, date of birth, ID band Patient awake    Reviewed: Allergy & Precautions, NPO status , Patient's Chart, lab work & pertinent test results  History of Anesthesia Complications Negative for: history of anesthetic complications  Airway Mallampati: IV   Neck ROM: Full    Dental no notable dental hx.    Pulmonary neg pulmonary ROS,    Pulmonary exam normal breath sounds clear to auscultation       Cardiovascular hypertension, Normal cardiovascular exam Rhythm:Regular Rate:Normal     Neuro/Psych  Headaches,    GI/Hepatic negative GI ROS, NAFLD; alpha 1 antitrypsin deficiency   Endo/Other  negative endocrine ROS  Renal/GU negative Renal ROS     Musculoskeletal   Abdominal   Peds  Hematology negative hematology ROS (+)   Anesthesia Other Findings   Reproductive/Obstetrics                            Anesthesia Physical Anesthesia Plan  ASA: 2  Anesthesia Plan: General   Post-op Pain Management:    Induction: Intravenous  PONV Risk Score and Plan: 3 and Propofol infusion, TIVA and Treatment may vary due to age or medical condition  Airway Management Planned: Natural Airway  Additional Equipment:   Intra-op Plan:   Post-operative Plan:   Informed Consent: I have reviewed the patients History and Physical, chart, labs and discussed the procedure including the risks, benefits and alternatives for the proposed anesthesia with the patient or authorized representative who has indicated his/her understanding and acceptance.       Plan Discussed with: CRNA  Anesthesia Plan Comments:        Anesthesia Quick Evaluation

## 2021-01-02 ENCOUNTER — Ambulatory Visit: Payer: BC Managed Care – PPO | Admitting: Anesthesiology

## 2021-01-02 ENCOUNTER — Encounter: Payer: Self-pay | Admitting: Gastroenterology

## 2021-01-02 ENCOUNTER — Encounter
Admission: RE | Disposition: A | Discharge status: Home / Self Care | Payer: Self-pay | Source: Home / Self Care | Attending: Gastroenterology

## 2021-01-02 ENCOUNTER — Other Ambulatory Visit: Payer: Self-pay

## 2021-01-02 ENCOUNTER — Ambulatory Visit
Admission: RE | Admit: 2021-01-02 | Discharge: 2021-01-02 | Disposition: A | Discharge status: Home / Self Care | Payer: BC Managed Care – PPO | Attending: Gastroenterology | Admitting: Gastroenterology

## 2021-01-02 DIAGNOSIS — Z85828 Personal history of other malignant neoplasm of skin: Secondary | ICD-10-CM | POA: Insufficient documentation

## 2021-01-02 DIAGNOSIS — K648 Other hemorrhoids: Secondary | ICD-10-CM | POA: Diagnosis not present

## 2021-01-02 DIAGNOSIS — K6289 Other specified diseases of anus and rectum: Secondary | ICD-10-CM | POA: Diagnosis not present

## 2021-01-02 DIAGNOSIS — Z1211 Encounter for screening for malignant neoplasm of colon: Secondary | ICD-10-CM

## 2021-01-02 DIAGNOSIS — Z79899 Other long term (current) drug therapy: Secondary | ICD-10-CM | POA: Insufficient documentation

## 2021-01-02 DIAGNOSIS — K644 Residual hemorrhoidal skin tags: Secondary | ICD-10-CM | POA: Insufficient documentation

## 2021-01-02 DIAGNOSIS — Z800 Family history of malignant neoplasm of digestive organs: Secondary | ICD-10-CM | POA: Diagnosis not present

## 2021-01-02 DIAGNOSIS — K635 Polyp of colon: Secondary | ICD-10-CM

## 2021-01-02 DIAGNOSIS — Z8371 Family history of colonic polyps: Secondary | ICD-10-CM

## 2021-01-02 DIAGNOSIS — Z83719 Family history of colon polyps, unspecified: Secondary | ICD-10-CM

## 2021-01-02 DIAGNOSIS — Z793 Long term (current) use of hormonal contraceptives: Secondary | ICD-10-CM | POA: Diagnosis not present

## 2021-01-02 HISTORY — PX: COLONOSCOPY WITH PROPOFOL: SHX5780

## 2021-01-02 HISTORY — PX: POLYPECTOMY: SHX5525

## 2021-01-02 HISTORY — DX: Alpha-1-antitrypsin deficiency: E88.01

## 2021-01-02 LAB — POCT PREGNANCY, URINE: Preg Test, Ur: NEGATIVE

## 2021-01-02 SURGERY — COLONOSCOPY WITH PROPOFOL
Anesthesia: General | Site: Rectum

## 2021-01-02 MED ORDER — SODIUM CHLORIDE 0.9 % IV SOLN: INTRAVENOUS | Status: DC

## 2021-01-02 MED ORDER — LACTATED RINGERS IV SOLN: INTRAVENOUS | Status: DC

## 2021-01-02 MED ORDER — PROPOFOL 10 MG/ML IV BOLUS
INTRAVENOUS | Status: DC | PRN
  Administered 2021-01-02 (×2): 20 mg via INTRAVENOUS
  Administered 2021-01-02: 30 mg via INTRAVENOUS
  Administered 2021-01-02 (×2): 20 mg via INTRAVENOUS
  Administered 2021-01-02: 40 mg via INTRAVENOUS
  Administered 2021-01-02 (×2): 20 mg via INTRAVENOUS
  Administered 2021-01-02: 80 mg via INTRAVENOUS
  Administered 2021-01-02: 20 mg via INTRAVENOUS
  Administered 2021-01-02 (×2): 30 mg via INTRAVENOUS
  Administered 2021-01-02: 20 mg via INTRAVENOUS

## 2021-01-02 MED ORDER — ONDANSETRON HCL 4 MG/2ML IJ SOLN: 4.0000 mg | Freq: Once | INTRAMUSCULAR | Status: DC | PRN

## 2021-01-02 MED ORDER — ACETAMINOPHEN 325 MG PO TABS: 650.0000 mg | ORAL_TABLET | Freq: Once | ORAL | Status: DC | PRN

## 2021-01-02 MED ORDER — ACETAMINOPHEN 160 MG/5ML PO SOLN: 325.0000 mg | ORAL | Status: DC | PRN

## 2021-01-02 MED ORDER — STERILE WATER FOR IRRIGATION IR SOLN
Status: DC | PRN
  Administered 2021-01-02: .05 mL

## 2021-01-02 MED ORDER — LIDOCAINE HCL (CARDIAC) PF 100 MG/5ML IV SOSY
PREFILLED_SYRINGE | INTRAVENOUS | Status: DC | PRN
  Administered 2021-01-02: 50 mg via INTRAVENOUS

## 2021-01-02 SURGICAL SUPPLY — 10 items
FCP ESCP3.2XJMB 240X2.8X (MISCELLANEOUS) ×2
FORCEPS BIOP RJ4 240 W/NDL (MISCELLANEOUS) ×3
FORCEPS ESCP3.2XJMB 240X2.8X (MISCELLANEOUS) IMPLANT
GOWN CVR UNV OPN BCK APRN NK (MISCELLANEOUS) ×4 IMPLANT
GOWN ISOL THUMB LOOP REG UNIV (MISCELLANEOUS) ×6
KIT PRC NS LF DISP ENDO (KITS) ×2 IMPLANT
KIT PROCEDURE OLYMPUS (KITS) ×3
MANIFOLD NEPTUNE II (INSTRUMENTS) ×3 IMPLANT
SNARE LASSO HEX 3 IN 1 (INSTRUMENTS) ×3 IMPLANT
WATER STERILE IRR 250ML POUR (IV SOLUTION) ×3 IMPLANT

## 2021-01-02 NOTE — Anesthesia Postprocedure Evaluation (Signed)
Anesthesia Post Note  Patient: Barbara Ortega  Procedure(s) Performed: COLONOSCOPY WITH PROPOFOL (Rectum) POLYPECTOMY (Rectum)     Patient location during evaluation: PACU Anesthesia Type: General Level of consciousness: awake and alert, oriented and patient cooperative Pain management: pain level controlled Vital Signs Assessment: post-procedure vital signs reviewed and stable Respiratory status: spontaneous breathing, nonlabored ventilation and respiratory function stable Cardiovascular status: blood pressure returned to baseline and stable Postop Assessment: adequate PO intake Anesthetic complications: no   No notable events documented.  Darrin Nipper

## 2021-01-02 NOTE — Anesthesia Procedure Notes (Signed)
Date/Time: 01/02/2021 8:06 AM Performed by: Mayme Genta, CRNA Pre-anesthesia Checklist: Patient identified, Emergency Drugs available, Suction available, Timeout performed and Patient being monitored Patient Re-evaluated:Patient Re-evaluated prior to induction Oxygen Delivery Method: Nasal cannula Placement Confirmation: positive ETCO2

## 2021-01-02 NOTE — Transfer of Care (Signed)
Immediate Anesthesia Transfer of Care Note  Patient: Barbara Ortega  Procedure(s) Performed: COLONOSCOPY WITH PROPOFOL (Rectum) POLYPECTOMY (Rectum)  Patient Location: PACU  Anesthesia Type: General  Level of Consciousness: awake, alert  and patient cooperative  Airway and Oxygen Therapy: Patient Spontanous Breathing and Patient connected to supplemental oxygen  Post-op Assessment: Post-op Vital signs reviewed, Patient's Cardiovascular Status Stable, Respiratory Function Stable, Patent Airway and No signs of Nausea or vomiting  Post-op Vital Signs: Reviewed and stable  Complications: No notable events documented.

## 2021-01-02 NOTE — Op Note (Signed)
[facility] Patient Name: Barbara Ortega Procedure Date: 01/02/2021 7:06 AM MRN: [id number] Account #: [id number] Date of Birth: [birth year] Admit Type: Outpatient Age: 45 Room: [facility] OR ROOM 01 Gender: Female Note Status: Finalized Procedure:             Colonoscopy Indications:           Screening for colorectal malignant neoplasm Providers:             Kassy Mcenroe B. Bonna Gains MD, MD Referring MD:          Angela Adam. Caryl Bis (Referring MD) Medicines:             Monitored Anesthesia Care Complications:         No immediate complications. Procedure:             Pre-Anesthesia Assessment:                        - ASA Grade Assessment: II - A patient with mild                         systemic disease.                        - Prior to the procedure, a History and Physical was                         performed, and patient medications, allergies and                         sensitivities were reviewed. The patient's tolerance                         of previous anesthesia was reviewed.                        - The risks and benefits of the procedure and the                         sedation options and risks were discussed with the                         patient. All questions were answered and informed                         consent was obtained.                        - Patient identification and proposed procedure were                         verified prior to the procedure by the physician, the                         nurse, the anesthesiologist, the anesthetist and the                         technician. The procedure was verified in the                         procedure room.  After obtaining informed consent, the colonoscope was                         passed under direct vision. Throughout the procedure,                         the patient's blood pressure, pulse, and oxygen                         saturations were monitored  continuously. The                         Colonoscope was introduced through the anus and                         advanced to the the cecum, identified by appendiceal                         orifice and ileocecal valve. The colonoscopy was                         performed with ease. The patient tolerated the                         procedure well. The quality of the bowel preparation                         was good. Findings:      The perianal exam findings include non-thrombosed external hemorrhoids.      A 3 mm polyp was found in the ascending colon. The polyp was sessile.       The polyp was removed with a jumbo cold forceps. Resection and retrieval       were complete.      Two flat polyps were found in the sigmoid colon. The polyps were 3 to 4       mm in size. These polyps were removed with a jumbo cold forceps.       Resection and retrieval were complete.      The exam was otherwise without abnormality.      The rectum, sigmoid colon, descending colon, transverse colon, ascending       colon and cecum appeared normal.      Non-bleeding internal hemorrhoids were found during retroflexion.      Anal papilla(e) were hypertrophied.      No additional abnormalities were found on retroflexion. Impression:            - Non-thrombosed external hemorrhoids found on                         perianal exam.                        - One 3 mm polyp in the ascending colon, removed with                         a jumbo cold forceps. Resected and retrieved.                        - Two 3 to 4 mm polyps in the  sigmoid colon, removed                         with a jumbo cold forceps. Resected and retrieved.                        - The examination was otherwise normal.                        - The rectum, sigmoid colon, descending colon,                         transverse colon, ascending colon and cecum are normal.                        - Non-bleeding internal hemorrhoids.                         - Anal papilla(e) were hypertrophied. Recommendation:        - Discharge patient to home (with escort).                        - Advance diet as tolerated.                        - Continue present medications.                        - Await pathology results.                        - Repeat colonoscopy date to be determined after                         pending pathology results are reviewed.                        - The findings and recommendations were discussed with                         the patient.                        - The findings and recommendations were discussed with                         the patient's family.                        - Return to primary care physician as previously                         scheduled.                        - High fiber diet. Procedure Code(s):     --- Professional ---                        [phone], Colonoscopy, flexible; with biopsy, single or  multiple Diagnosis Code(s):     --- Professional ---                        K63.5, Polyp of colon                        Z12.11, Encounter for screening for malignant neoplasm                         of colon CPT copyright 2019 American Medical Association. All rights reserved. The codes documented in this report are preliminary and upon coder review may  be revised to meet current compliance requirements.  Vonda Antigua, MD Margretta Sidle B. Bonna Gains MD, MD 01/02/2021 8:34:13 AM This report has been signed electronically. Number of Addenda: 0 Note Initiated On: 01/02/2021 7:06 AM Scope Withdrawal Time: 0 hours 17 minutes 17 seconds  Total Procedure Duration: 0 hours 21 minutes 18 seconds  Estimated Blood Loss:  Estimated blood loss: none.      [facility]

## 2021-01-02 NOTE — H&P (Signed)
Vonda Antigua, MD [address] [place], [place], [place], [address] [address], [place], [place], [place], [address] Phone: [phone]  Fax: [phone]  Primary Care Physician:  Leone Haven, MD   Pre-Procedure History & Physical: HPI:  Barbara Ortega is a 45 y.o. female is here for a colonoscopy.   Past Medical History:  Diagnosis Date   Alpha-1-antitrypsin deficiency ([place])    Basal cell carcinoma 04/29/2008   Left medial infraocular   Dysplastic nevus 01/24/2009   Left 3rd distal toe, periungual. Moderate atypia, close to margin   Hypertension    Migraine    Sinus disease     Past Surgical History:  Procedure Laterality Date   BREAST BIOPSY Right 2019   Benign   BREAST BIOPSY Left 2012   benign   NASAL SINUS SURGERY  2011   Dr. Kathyrn Sheriff   VAGINAL DELIVERY     1   WISDOM TOOTH EXTRACTION      Prior to Admission medications   Medication Sig Start Date End Date Taking? Authorizing Provider  cetirizine (ZYRTEC) 10 MG tablet Take 10 mg by mouth daily.   Yes [provider]  hydrochlorothiazide (MICROZIDE) 12.5 MG capsule TAKE 1 CAPSULE(12.5 MG) BY MOUTH DAILY 08/11/18  Yes Leone Haven, MD  levonorgestrel (MIRENA) 20 MCG/24HR IUD 1 each by Intrauterine route once.   Yes [provider]  Multiple Vitamin (MULTIVITAMIN) tablet Take 1 tablet by mouth daily.   Yes [provider]    Allergies as of 11/23/2020 - Review Complete 11/23/2020  Allergen Reaction Noted   Elemental sulfur Hives 08/05/2012    Family History  Problem Relation Age of Onset   Hypertension Mother    Cancer Father        lung   Breast cancer Maternal Aunt     Social History   Socioeconomic History   Marital status: Married    Spouse name: Not on file   Number of children: Not on file   Years of education: Not on file   Highest education level: Not on file  Occupational History   Not on file  Tobacco Use   Smoking status: Never    Smokeless tobacco: Never  Vaping Use   Vaping Use: Never used  Substance and Sexual Activity   Alcohol use: No    Alcohol/week: 0.0 standard drinks   Drug use: No   Sexual activity: Not on file  Other Topics Concern   Not on file  Social History Narrative   Lives with son in [place].      Work - Software engineer, Research scientist (life sciences)      Diet - regular      Exercise- occasional   Social Determinants of Radio broadcast assistant Strain: Not on Art therapist Insecurity: Not on file  Transportation Needs: Not on file  Physical Activity: Not on file  Stress: Not on file  Social Connections: Not on file  Intimate Partner Violence: Not on file    Review of Systems: See HPI, otherwise negative ROS  Physical Exam: Constitutional: General:   Alert,  Well-developed, well-nourished, pleasant and cooperative in NAD BP 125/90   Pulse 72   Temp 98.8 F (37.1 C) (Temporal)   Ht '5\' 3"'$  (1.6 m)   Wt 84.1 kg   SpO2 97%   BMI 32.84 kg/m   Head: Normocephalic, atraumatic.   Eyes:  Sclera clear, no icterus.   Conjunctiva pink.   Mouth:  No deformity or lesions,  oropharynx pink & moist.  Neck:  Supple, trachea midline  Respiratory: Normal respiratory effort  Gastrointestinal:  Soft, non-tender and non-distended without masses, hepatosplenomegaly or hernias noted.  No guarding or rebound tenderness.     Cardiac: No clubbing or edema.  No cyanosis. Normal posterior tibial pedal pulses noted.  Lymphatic:  No significant cervical adenopathy.  Psych:  Alert and cooperative. Normal mood and affect.  Musculoskeletal:   Symmetrical without gross deformities. 5/5 Lower extremity strength bilaterally.  Skin: Warm. Intact without significant lesions or rashes. No jaundice.  Neurologic:  Face symmetrical, tongue midline, Normal sensation to touch;  grossly normal neurologically.  Psych:  Alert and oriented x3, Alert and cooperative. Normal mood and affect.  Impression/Plan: Barbara Ortega is here for a colonoscopy to be performed for average risk screening. Pt reports history of colon cancer in her grandmother but not in any first degree relatives  Risks, benefits, limitations, and alternatives regarding  colonoscopy have been reviewed with the patient.  Questions have been answered.  All parties agreeable.   Virgel Manifold, MD  01/02/2021, 7:56 AM

## 2021-01-03 ENCOUNTER — Other Ambulatory Visit: Payer: Self-pay | Admitting: Family Medicine

## 2021-01-03 ENCOUNTER — Encounter: Payer: Self-pay | Admitting: Gastroenterology

## 2021-01-03 DIAGNOSIS — I10 Essential (primary) hypertension: Secondary | ICD-10-CM

## 2021-01-04 ENCOUNTER — Ambulatory Visit
Admission: RE | Admit: 2021-01-04 | Discharge: 2021-01-04 | Disposition: A | Discharge status: Home / Self Care | Payer: BC Managed Care – PPO | Source: Ambulatory Visit | Attending: Family Medicine | Admitting: Family Medicine

## 2021-01-04 ENCOUNTER — Encounter: Payer: Self-pay | Admitting: Gastroenterology

## 2021-01-04 ENCOUNTER — Other Ambulatory Visit: Payer: Self-pay

## 2021-01-04 DIAGNOSIS — Z1231 Encounter for screening mammogram for malignant neoplasm of breast: Secondary | ICD-10-CM

## 2021-01-04 LAB — SURGICAL PATHOLOGY

## 2021-01-04 MED ORDER — HYDROCHLOROTHIAZIDE 12.5 MG PO CAPS: 12.5000 mg | ORAL_CAPSULE | Freq: Every day | ORAL | 0 refills | Status: DC

## 2021-02-19 ENCOUNTER — Ambulatory Visit: Payer: BC Managed Care – PPO | Admitting: Family Medicine

## 2021-03-15 ENCOUNTER — Encounter: Payer: Self-pay | Admitting: Emergency Medicine

## 2021-03-15 ENCOUNTER — Ambulatory Visit
Admission: EM | Admit: 2021-03-15 | Discharge: 2021-03-15 | Disposition: A | Discharge status: Home / Self Care | Payer: BC Managed Care – PPO | Attending: Physician Assistant | Admitting: Physician Assistant

## 2021-03-15 DIAGNOSIS — Z20822 Contact with and (suspected) exposure to covid-19: Secondary | ICD-10-CM | POA: Insufficient documentation

## 2021-03-15 DIAGNOSIS — J069 Acute upper respiratory infection, unspecified: Secondary | ICD-10-CM

## 2021-03-15 DIAGNOSIS — R0981 Nasal congestion: Secondary | ICD-10-CM | POA: Diagnosis not present

## 2021-03-15 DIAGNOSIS — J029 Acute pharyngitis, unspecified: Secondary | ICD-10-CM | POA: Diagnosis not present

## 2021-03-15 LAB — SARS CORONAVIRUS 2 (TAT 6-24 HRS): SARS Coronavirus 2: NEGATIVE

## 2021-03-15 NOTE — Discharge Instructions (Addendum)

## 2021-03-15 NOTE — ED Triage Notes (Signed)
Pt presents today with c/o runny nose, headache, facial pain and sore throat that began yesterday. Home Covid test this morning, negative. Pt declines to have Covid test today. She believes she has a sinus infection.

## 2021-03-15 NOTE — ED Provider Notes (Signed)
Call [place]    CSN: [id number] Arrival date & time: 03/15/21  0830      History   Chief Complaint Chief Complaint  Patient presents with   Facial Pain   Sore Throat   Nasal Congestion    HPI Barbara Ortega is a 45 y.o. female presenting for onset of nasal congestion/runny nose, headache and facial pressure/pain as well as sore throat and postnasal drainage yesterday.  Patient said her son was sick with similar symptoms but they were more GI related earlier in the week.  She denies any associated fever, fatigue, aches.  No reports of breathing difficulty, diarrhea or vomiting.  Patient did take an at home COVID test today which was negative.  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  Has been taking over-the-counter decongestants but says when it wears off the symptoms seem worse.  Patient concerned about potential sinus infection.  No other complaints.  HPI  Past Medical History:  Diagnosis Date   Alpha-1-antitrypsin deficiency ([place])    Basal cell carcinoma 04/29/2008   Left medial infraocular   Dysplastic nevus 01/24/2009   Left 3rd distal toe, periungual. Moderate atypia, close to margin   Hypertension    Migraine    Sinus disease     Patient Active Problem List   Diagnosis Date Noted   Tension headache 10/26/2018   Cough 10/26/2018   Hypertriglyceridemia 10/26/2018   Contraceptive management 09/21/2014   Alpha-1-antitrypsin deficiency ([place]) 08/22/2014   Migraine with aura 08/22/2014   Fatty infiltration of liver 08/11/2013   Essential hypertension, benign 09/07/2012   Allergic rhinitis 08/05/2012    Past Surgical History:  Procedure Laterality Date   BREAST BIOPSY Right 2019   Benign   BREAST BIOPSY Left 2012   benign   COLONOSCOPY WITH PROPOFOL N/A 01/02/2021   Procedure: COLONOSCOPY WITH PROPOFOL;  Surgeon: Virgel Manifold, MD;  Location: [facility];  Service: Endoscopy;  Laterality: N/A;   NASAL  SINUS SURGERY  2011   Dr. Kathyrn Sheriff   POLYPECTOMY  01/02/2021   Procedure: POLYPECTOMY;  Surgeon: Virgel Manifold, MD;  Location: [facility];  Service: Endoscopy;;   VAGINAL DELIVERY     1   WISDOM TOOTH EXTRACTION      OB History   No obstetric history on file.      Home Medications    Prior to Admission medications   Medication Sig Start Date End Date Taking? Authorizing Provider  cetirizine (ZYRTEC) 10 MG tablet Take 10 mg by mouth daily.    [provider]  hydrochlorothiazide (MICROZIDE) 12.5 MG capsule Take 1 capsule (12.5 mg total) by mouth daily. 01/04/21   Leone Haven, MD  levonorgestrel (MIRENA) 20 MCG/24HR IUD 1 each by Intrauterine route once.    [provider]  Multiple Vitamin (MULTIVITAMIN) tablet Take 1 tablet by mouth daily.    [provider]    Family History Family History  Problem Relation Age of Onset   Hypertension Mother    Cancer Father        lung   Breast cancer Maternal Aunt     Social History Social History   Tobacco Use   Smoking status: Never   Smokeless tobacco: Never  Vaping Use   Vaping Use: Never used  Substance Use Topics   Alcohol use: No    Alcohol/week: 0.0 standard drinks   Drug use: No     Allergies   Elemental  sulfur   Review of Systems Review of Systems  Constitutional:  Negative for chills, diaphoresis, fatigue and fever.  HENT:  Positive for congestion, ear pain, postnasal drip, rhinorrhea, sinus pressure and sore throat.   Respiratory:  Positive for cough. Negative for shortness of breath.   Gastrointestinal:  Negative for abdominal pain, nausea and vomiting.  Musculoskeletal:  Negative for arthralgias and myalgias.  Skin:  Negative for rash.  Neurological:  Negative for weakness and headaches.  Hematological:  Negative for adenopathy.    Physical Exam Triage Vital Signs ED Triage Vitals  Enc Vitals Group     BP 03/15/21 0850 (!) 142/86     Pulse Rate  03/15/21 0850 90     Resp 03/15/21 0850 16     Temp 03/15/21 0850 99.4 F (37.4 C)     Temp Source 03/15/21 0850 Oral     SpO2 03/15/21 0850 96 %     Weight --      Height --      Head Circumference --      Peak Flow --      Pain Score 03/15/21 0847 5     Pain Loc --      Pain Edu? --      Excl. in [place]? --    No data found.  Updated Vital Signs BP (!) 142/86 (BP Location: Right Arm)   Pulse 90   Temp 99.4 F (37.4 C) (Oral)   Resp 16   SpO2 96%   Physical Exam Vitals and nursing note reviewed.  Constitutional:      General: She is not in acute distress.    Appearance: Normal appearance. She is not ill-appearing or toxic-appearing.  HENT:     Head: Normocephalic and atraumatic.     Right Ear: A middle ear effusion is present.     Left Ear: A middle ear effusion is present.     Nose: Congestion present.     Mouth/Throat:     Mouth: Mucous membranes are moist.     Pharynx: Oropharynx is clear. Posterior oropharyngeal erythema (mild with clear PND) present.  Eyes:     General: No scleral icterus.       Right eye: No discharge.        Left eye: No discharge.     Conjunctiva/sclera: Conjunctivae normal.  Cardiovascular:     Rate and Rhythm: Normal rate and regular rhythm.     Heart sounds: Normal heart sounds.  Pulmonary:     Effort: Pulmonary effort is normal. No respiratory distress.     Breath sounds: Normal breath sounds.  Musculoskeletal:     Cervical back: Neck supple.  Skin:    General: Skin is dry.  Neurological:     General: No focal deficit present.     Mental Status: She is alert. Mental status is at baseline.     Motor: No weakness.     Gait: Gait normal.  Psychiatric:        Mood and Affect: Mood normal.        Behavior: Behavior normal.        Thought Content: Thought content normal.     UC Treatments / Results  Labs (all labs ordered are listed, but only abnormal results are displayed) Labs Reviewed  SARS CORONAVIRUS 2 (TAT 6-24 HRS)     EKG   Radiology No results found.  Procedures Procedures (including critical care time)  Medications Ordered in UC Medications - No data to display  Initial Impression / Assessment and Plan / UC Course  I have reviewed the triage vital signs and the nursing notes.  Pertinent labs & imaging results that were available during my care of the patient were reviewed by me and considered in my medical decision making (see chart for details).  45 year old female presenting for 1 day history of nasal congestion, sinus pressure/pain, postnasal drainage and sore throat.  Patient also has ear pain.  In contact with her son who has been sick but no known COVID exposure.  Vitals normal and stable patient is overall well-appearing.  Exam significant for minor mid ear effusions of TMs, nasal congestion, mild posterior pharyngeal erythema and clear visible drainage.  Chest clear to auscultation heart regular rate and rhythm.  PCR COVID is obtained.  Current CDC guidelines, isolation protocol and ED precautions reviewed with patient.  Advise patient her symptoms are consistent with viral illness at this time.  Encouraged to increase rest and fluids and continue with over-the-counter decongestants and add Flonase and nasal saline.  Reviewed that if symptoms are still present after 10 days or if they significantly worsen we consider antibiotics but not this soon.  No evidence of bacterial infection based on today's exam.  Follow-up as needed.  Work note given.  Final Clinical Impressions(s) / UC Diagnoses   Final diagnoses:  Viral upper respiratory tract infection  Nasal congestion  Sore throat     Discharge Instructions      URI/COLD SYMPTOMS: Your exam today is consistent with a viral illness. Antibiotics are not indicated at this time. Use medications as directed, including cough syrup, nasal saline, and decongestants. Your symptoms should improve over the next few days and resolve within  7-10 days. Increase rest and fluids. F/u if symptoms worsen or predominate such as sore throat, ear pain, productive cough, shortness of breath, or if you develop high fevers or worsening fatigue over the next several days.    You have received COVID testing today either for positive exposure, concerning symptoms that could be related to COVID infection, screening purposes, or re-testing after confirmed positive.  Your test obtained today checks for active viral infection in the last 1-2 weeks. If your test is negative now, you can still test positive later. So, if you do develop symptoms you should either get re-tested and/or isolate x 5 days and then strict mask use x 5 days (unvaccinated) or mask use x 10 days (vaccinated). Please follow CDC guidelines.  While Rapid antigen tests come back in 15-20 minutes, send out PCR/molecular test results typically come back within 1-3 days. In the mean time, if you are symptomatic, assume this could be a positive test and treat/monitor yourself as if you do have COVID.   We will call with test results if positive. Please download the MyChart app and set up a profile to access test results.   If symptomatic, go home and rest. Push fluids. Take Tylenol as needed for discomfort. Gargle warm salt water. Throat lozenges. Take Mucinex DM or Robitussin for cough. Humidifier in bedroom to ease coughing. Warm showers. Also review the COVID handout for more information.  COVID-19 INFECTION: The incubation period of COVID-19 is approximately 14 days after exposure, with most symptoms developing in roughly 4-5 days. Symptoms may range in severity from mild to critically severe. Roughly 80% of those infected will have mild symptoms. People of any age may become infected with COVID-19 and have the ability to transmit the virus. The most common symptoms include:  fever, fatigue, cough, body aches, headaches, sore throat, nasal congestion, shortness of breath, nausea, vomiting,  diarrhea, changes in smell and/or taste.    COURSE OF ILLNESS Some patients may begin with mild disease which can progress quickly into critical symptoms. If your symptoms are worsening please call ahead to the Emergency Department and proceed there for further treatment. Recovery time appears to be roughly 1-2 weeks for mild symptoms and 3-6 weeks for severe disease.   GO IMMEDIATELY TO ER FOR FEVER YOU ARE UNABLE TO GET DOWN WITH TYLENOL, BREATHING PROBLEMS, CHEST PAIN, FATIGUE, LETHARGY, INABILITY TO EAT OR DRINK, ETC  QUARANTINE AND ISOLATION: To help decrease the spread of COVID-19 please remain isolated if you have COVID infection or are highly suspected to have COVID infection. This means -stay home and isolate to one room in the home if you live with others. Do not share a bed or bathroom with others while ill, sanitize and wipe down all countertops and keep common areas clean and disinfected. Stay home for 5 days. If you have no symptoms or your symptoms are resolving after 5 days, you can leave your house. Continue to wear a mask around others for 5 additional days. If you have been in close contact (within 6 feet) of someone diagnosed with COVID 19, you are advised to quarantine in your home for 14 days as symptoms can develop anywhere from 2-14 days after exposure to the virus. If you develop symptoms, you  must isolate.  Most current guidelines for COVID after exposure -unvaccinated: isolate 5 days and strict mask use x 5 days. Test on day 5 is possible -vaccinated: wear mask x 10 days if symptoms do not develop -You do not necessarily need to be tested for COVID if you have + exposure and  develop symptoms. Just isolate at home x10 days from symptom onset During this global pandemic, CDC advises to practice social distancing, try to stay at least 76ft away from others at all times. Wear a face covering. Wash and sanitize your hands regularly and avoid going anywhere that is not  necessary.  KEEP IN MIND THAT THE COVID TEST IS NOT 100% ACCURATE AND YOU SHOULD STILL DO EVERYTHING TO PREVENT POTENTIAL SPREAD OF VIRUS TO OTHERS (WEAR MASK, WEAR GLOVES, [place] HANDS AND SANITIZE REGULARLY). IF INITIAL TEST IS NEGATIVE, THIS MAY NOT MEAN YOU ARE DEFINITELY NEGATIVE. MOST ACCURATE TESTING IS DONE 5-7 DAYS AFTER EXPOSURE.   It is not advised by CDC to get re-tested after receiving a positive COVID test since you can still test positive for weeks to months after you have already cleared the virus.   *If you have not been vaccinated for COVID, I strongly suggest you consider getting vaccinated as long as there are no contraindications.       ED Prescriptions   None    PDMP not reviewed this encounter.   Danton Clap, PA-C 03/15/21 [phone]

## 2021-03-29 ENCOUNTER — Telehealth (INDEPENDENT_AMBULATORY_CARE_PROVIDER_SITE_OTHER): Payer: BC Managed Care – PPO | Admitting: Family Medicine

## 2021-03-29 DIAGNOSIS — R059 Cough, unspecified: Secondary | ICD-10-CM | POA: Diagnosis not present

## 2021-03-29 DIAGNOSIS — H9202 Otalgia, left ear: Secondary | ICD-10-CM

## 2021-03-29 MED ORDER — BENZONATATE 100 MG PO CAPS: ORAL_CAPSULE | ORAL | 0 refills | Status: DC

## 2021-03-29 MED ORDER — AMOXICILLIN-POT CLAVULANATE 875-125 MG PO TABS: 1.0000 | ORAL_TABLET | Freq: Two times a day (BID) | ORAL | 0 refills | Status: DC

## 2021-03-29 NOTE — Patient Instructions (Signed)
-  I sent the medication(s) we discussed to your pharmacy: Meds ordered this encounter  Medications   amoxicillin-clavulanate (AUGMENTIN) 875-125 MG tablet    Sig: Take 1 tablet by mouth 2 (two) times daily.    Dispense:  20 tablet    Refill:  0   benzonatate (TESSALON PERLES) 100 MG capsule    Sig: 1-2 capsules up to twice daily as needed for cough    Dispense:  30 capsule    Refill:  0   Could try 3 days of Afrin nasal spray.   I hope you are feeling better soon!  Seek in person care promptly if your symptoms worsen, new concerns arise or you are not improving with treatment.  It was nice to meet you today. I help Barbara Ortega out with telemedicine visits on Tuesdays and Thursdays and am available for visits on those days. If you have any concerns or questions following this visit please schedule a follow up visit with your Primary Care doctor or seek care at a local urgent care clinic to avoid delays in care.

## 2021-03-29 NOTE — Progress Notes (Signed)
Virtual Visit via Video Note  I connected with Barbara Ortega  on 03/29/21 at 12:00 PM EDT by a video enabled telemedicine application and verified that I am speaking with the correct person using two identifiers.  Location patient: work, [place] Location provider:work or home office Persons participating in the virtual visit: patient, provider  I discussed the limitations of evaluation and management by telemedicine and the availability of in person appointments. The patient expressed understanding and agreed to proceed.   HPI:  Acute telemedicine visit for sinus and ear issues: -Onset: 2 weeks ago -Symptoms include: started out with resp symptoms, headaches and fevers and was told had a viral infection at urgent care - fevers resolved but L ear has been sore for about a week and has not improved, thick drainage yellow and greed in throat -has had several negative covid test -Denies: fever now, drainage/blood from ear, hearing loss -Has tried:nettie pot -Pertinent past medical history: see below -Pertinent medication allergies:  Allergies  Allergen Reactions   Elemental Sulfur Hives  -COVID-19 vaccine status: Immunization History  Administered Date(s) Administered   Hep A / Hep B 11/17/2013, 02/14/2014, 08/05/2014   Influenza Split 04/14/2020   Influenza,inj,Quad PF,6+ Mos 04/12/2013, 04/22/2016   Influenza-Unspecified 02/22/2012, 03/10/2014, 02/10/2015, 02/25/2018, 02/25/2019   Moderna Sars-Covid-2 Vaccination 07/26/2019, 09/25/2019, 09/18/2020   Td 11/17/2020   Tdap 08/06/2010    ROS: See pertinent positives and negatives per HPI.  Past Medical History:  Diagnosis Date   Alpha-1-antitrypsin deficiency ([place])    Basal cell carcinoma 04/29/2008   Left medial infraocular   Dysplastic nevus 01/24/2009   Left 3rd distal toe, periungual. Moderate atypia, close to margin   Hypertension    Migraine    Sinus disease     Past Surgical History:  Procedure Laterality Date   BREAST BIOPSY  Right 2019   Benign   BREAST BIOPSY Left 2012   benign   COLONOSCOPY WITH PROPOFOL N/A 01/02/2021   Procedure: COLONOSCOPY WITH PROPOFOL;  Surgeon: Virgel Manifold, MD;  Location: [facility];  Service: Endoscopy;  Laterality: N/A;   NASAL SINUS SURGERY  2011   Dr. Kathyrn Sheriff   POLYPECTOMY  01/02/2021   Procedure: POLYPECTOMY;  Surgeon: Virgel Manifold, MD;  Location: [facility];  Service: Endoscopy;;   VAGINAL DELIVERY     1   WISDOM TOOTH EXTRACTION       Current Outpatient Medications:    amoxicillin-clavulanate (AUGMENTIN) 875-125 MG tablet, Take 1 tablet by mouth 2 (two) times daily., Disp: 20 tablet, Rfl: 0   benzonatate (TESSALON PERLES) 100 MG capsule, 1-2 capsules up to twice daily as needed for cough, Disp: 30 capsule, Rfl: 0   cetirizine (ZYRTEC) 10 MG tablet, Take 10 mg by mouth daily., Disp: , Rfl:    hydrochlorothiazide (MICROZIDE) 12.5 MG capsule, Take 1 capsule (12.5 mg total) by mouth daily., Disp: 90 capsule, Rfl: 0   levonorgestrel (MIRENA) 20 MCG/24HR IUD, 1 each by Intrauterine route once., Disp: , Rfl:    Multiple Vitamin (MULTIVITAMIN) tablet, Take 1 tablet by mouth daily., Disp: , Rfl:   EXAM:  VITALS per patient if applicable:  GENERAL: alert, oriented, appears well and in no acute distress  HEENT: atraumatic, conjunttiva clear, no obvious abnormalities on inspection of external nose and ears  NECK: normal movements of the head and neck  LUNGS: on inspection no signs of respiratory distress, breathing rate appears normal, no obvious gross SOB, gasping or wheezing  CV: no obvious cyanosis  MS: moves  all visible extremities without noticeable abnormality  PSYCH/NEURO: pleasant and cooperative, no obvious depression or anxiety, speech and thought processing grossly intact  ASSESSMENT AND PLAN:  Discussed the following assessment and plan:  Left ear pain  Cough, unspecified type  -we discussed possible serious and likely  etiologies, options for evaluation and workup, limitations of telemedicine visit vs in person visit, treatment, treatment risks and precautions. Pt is agreeable to treatment via telemedicine at this moment. Query Sinusitis, possible OM or ETD 2ndary to resolving VRI vs other. She opted to try empiric treatment with Augmentin 875 bid x10 days and tessalon for cough.  Advised to seek prompt in person care if worsening, new symptoms arise, or if is not improving with treatment. Discussed options for inperson care if PCP office not available. Did let this patient know that I only do telemedicine on Tuesdays and Thursdays for [place]. Advised to schedule follow up visit with PCP or [facility] if any further questions or concerns to avoid delays in care.   I discussed the assessment and treatment plan with the patient. The patient was provided an opportunity to ask questions and all were answered. The patient agreed with the plan and demonstrated an understanding of the instructions.     Lucretia Kern, DO

## 2021-04-02 ENCOUNTER — Ambulatory Visit: Payer: BC Managed Care – PPO | Admitting: Family Medicine

## 2021-04-04 ENCOUNTER — Other Ambulatory Visit: Payer: Self-pay | Admitting: Family Medicine

## 2021-04-04 DIAGNOSIS — I10 Essential (primary) hypertension: Secondary | ICD-10-CM

## 2021-05-09 ENCOUNTER — Ambulatory Visit (INDEPENDENT_AMBULATORY_CARE_PROVIDER_SITE_OTHER): Payer: BC Managed Care – PPO

## 2021-05-09 ENCOUNTER — Other Ambulatory Visit: Payer: Self-pay

## 2021-05-09 ENCOUNTER — Ambulatory Visit (INDEPENDENT_AMBULATORY_CARE_PROVIDER_SITE_OTHER): Payer: BC Managed Care – PPO | Admitting: Family Medicine

## 2021-05-09 ENCOUNTER — Encounter: Payer: Self-pay | Admitting: Family Medicine

## 2021-05-09 VITALS — BP 121/70 | HR 68 | Temp 98.5°F | Ht 63.0 in | Wt 186.8 lb

## 2021-05-09 DIAGNOSIS — M25562 Pain in left knee: Secondary | ICD-10-CM

## 2021-05-09 DIAGNOSIS — I10 Essential (primary) hypertension: Secondary | ICD-10-CM

## 2021-05-09 NOTE — Patient Instructions (Signed)
Nice to see you. We will get an x-ray today. Please continue with your hydrochlorothiazide.

## 2021-05-09 NOTE — Progress Notes (Signed)
Tommi Rumps, MD Phone: [phone]  Barbara Ortega is a 45 y.o. female who presents today for f/u.  HYPERTENSION Disease Monitoring Home BP Monitoring not checking Chest pain- no    Dyspnea- no Medications Compliance-  taking HCTZ.   Edema- no BMET    Component Value Date/Time   NA 138 11/17/2020 1352   K 4.1 11/17/2020 1352   CL 101 11/17/2020 1352   CO2 28 11/17/2020 1352   GLUCOSE 80 11/17/2020 1352   BUN 16 11/17/2020 1352   CREATININE 0.81 11/17/2020 1352   CALCIUM 9.9 11/17/2020 1352   Left knee pain/swelling: Patient notes for a month or so she has had some medial joint line pain if she moves wrong way.  She notes it is puffy in that area.  She notes no specific injury.  Social History   Tobacco Use  Smoking Status Never  Smokeless Tobacco Never    Current Outpatient Medications on File Prior to Visit  Medication Sig Dispense Refill   benzonatate (TESSALON PERLES) 100 MG capsule 1-2 capsules up to twice daily as needed for cough 30 capsule 0   cetirizine (ZYRTEC) 10 MG tablet Take 10 mg by mouth daily.     hydrochlorothiazide (MICROZIDE) 12.5 MG capsule TAKE 1 CAPSULE(12.5 MG) BY MOUTH DAILY 90 capsule 0   levonorgestrel (MIRENA) 20 MCG/24HR IUD 1 each by Intrauterine route once.     Multiple Vitamin (MULTIVITAMIN) tablet Take 1 tablet by mouth daily.     No current facility-administered medications on file prior to visit.     ROS see history of present illness  Objective  Physical Exam Vitals:   05/09/21 1603  BP: 121/70  Pulse: 68  Temp: 98.5 F (36.9 C)  SpO2: 98%    BP Readings from Last 3 Encounters:  05/09/21 121/70  03/15/21 (!) 142/86  01/02/21 131/84   Wt Readings from Last 3 Encounters:  05/09/21 186 lb 12.8 oz (84.7 kg)  01/02/21 185 lb 6.4 oz (84.1 kg)  11/17/20 187 lb 6.4 oz (85 kg)    Physical Exam Constitutional:      General: She is not in acute distress.    Appearance: She is not diaphoretic.  Cardiovascular:      Rate and Rhythm: Normal rate and regular rhythm.     Heart sounds: Normal heart sounds.  Pulmonary:     Effort: Pulmonary effort is normal.     Breath sounds: Normal breath sounds.  Musculoskeletal:     Comments: Left knee with slight puffiness along the medial joint line, there is no tenderness of the knee, negative McMurray's  Skin:    General: Skin is warm and dry.  Neurological:     Mental Status: She is alert.     Assessment/Plan: Please see individual problem list.  Problem List Items Addressed This Visit     Essential hypertension, benign    Well-controlled today.  She will continue hydrochlorothiazide 12.5 mg once daily.  I have asked that she start checking her blood pressure at home periodically.      Left knee pain - Primary    Intermittent issues with left knee pain though she has had persistent medial joint line swelling.  Discussed the potential for arthritis versus a meniscal issue.  We will get an x-ray today and then determine what other management is needed.      Relevant Orders   DG Knee Complete 4 Views Left     Return in about 6 months (around 11/07/2021) for  htn.  This visit occurred during the SARS-CoV-2 public health emergency.  Safety protocols were in place, including screening questions prior to the visit, additional usage of staff PPE, and extensive cleaning of exam room while observing appropriate contact time as indicated for disinfecting solutions.    Tommi Rumps, MD [place]

## 2021-05-09 NOTE — Assessment & Plan Note (Signed)
Intermittent issues with left knee pain though she has had persistent medial joint line swelling.  Discussed the potential for arthritis versus a meniscal issue.  We will get an x-ray today and then determine what other management is needed.

## 2021-05-09 NOTE — Assessment & Plan Note (Signed)
Well-controlled today.  She will continue hydrochlorothiazide 12.5 mg once daily.  I have asked that she start checking her blood pressure at home periodically.

## 2021-05-16 ENCOUNTER — Other Ambulatory Visit: Payer: Self-pay | Admitting: Family Medicine

## 2021-05-16 DIAGNOSIS — M25562 Pain in left knee: Secondary | ICD-10-CM

## 2021-05-31 ENCOUNTER — Other Ambulatory Visit: Payer: Self-pay

## 2021-05-31 ENCOUNTER — Ambulatory Visit: Payer: BC Managed Care – PPO | Admitting: Physician Assistant

## 2021-05-31 DIAGNOSIS — M25562 Pain in left knee: Secondary | ICD-10-CM | POA: Diagnosis not present

## 2021-05-31 DIAGNOSIS — G8929 Other chronic pain: Secondary | ICD-10-CM | POA: Diagnosis not present

## 2021-05-31 NOTE — Progress Notes (Signed)
Office Visit Note   Patient: Barbara Ortega           Date of Birth: Mar 07, 1976           MRN: [id number] Visit Date: 05/31/2021              Requested by: Leone Haven, MD [address] [place],  [place] [address] PCP: Leone Haven, MD  Chief Complaint  Patient presents with   Left Knee - Pain      HPI: Patient is a pleasant active 46 year old woman with a 71-month history of left knee swelling and pain.  She denies any injuries.  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  Her biggest area of concern is on the medial side of her knee.  She said originally she had a lot of swelling associated with this and that has subsided a little bit but she continues to have focal pain over the medial side of her knee.  No instability.  She does notice it especially when she is trying to squat or fully extend her leg.  She has had episodes of her knee locking and then giving way.  She also has some pain at the superior pole of the patella.  She has tried anti-inflammatories and gentle strengthening without improvement.  Now going on for 2 months.  Still has swelling especially over the medial joint.  Assessment & Plan: Visit Diagnoses:  1. Chronic pain of left knee     Plan: Active 46 year old woman with early arthritic changes in the patellofemoral joint slight joint narrowing on the medial side but does have mechanical symptoms the last 2 months including locking and difficulty squatting.  I have given her instructions for some close chain exercises and strengthening for her patellofemoral joint.  As far as her medial symptoms I am concerned for a degenerative tear of the medial meniscus.  She would like an MRI to evaluate this further.  We will order the MRI where she lives in [place].  She could then follow-up with Dr. Durward Fortes to review this.  Also discussed Voltaren gel with her  Follow-Up Instructions: No follow-ups on file.    Ortho Exam  Patient is alert, oriented, no adenopathy, well-dressed, normal affect, normal respiratory effort. Examination of her left knee: She does have some mild soft tissue swelling with a very minimal effusion.  No cellulitis no erythema.  She does have some tenderness over the superior pole of the patella.  She is able to maintain a straight leg raise without difficulty.  She has more focal tenderness over the medial joint line.  Good varus valgus stability no tenderness over the lateral joint line  Imaging: No results found. No images are attached to the encounter.  Labs: Lab Results  Component Value Date   HGBA1C 5.5 11/17/2020   REPTSTATUS 07/01/2017 FINAL 06/28/2017   CULT  06/28/2017    NO GROUP A STREP (S.PYOGENES) ISOLATED Performed at [facility], [place] [address] [place], [place] [address]    LABORGA NO GROWTH 06/14/2015     Lab Results  Component Value Date   ALBUMIN 4.7 11/17/2020   ALBUMIN 4.7 04/27/2018   ALBUMIN 4.6 04/22/2016    No results found for: MG Lab Results  Component Value Date   VD25OH 19.67 (L) 07/19/2015   VD25OH 33 08/05/2012    No results found for: PREALBUMIN CBC EXTENDED Latest Ref Rng & Units 07/19/2015 08/05/2012  WBC  4.0 - 10.5 K/uL 9.5 6.7  RBC 3.87 - 5.11 Mil/uL 5.26(H) 4.87  HGB 12.0 - 15.0 g/dL 14.4 13.2  HCT 36.0 - 46.0 % 42.7 39.2  PLT 150.0 - 400.0 K/uL 320.0 347.0  NEUTROABS 1.4 - 7.7 K/uL 5.4 3.4  LYMPHSABS 0.7 - 4.0 K/uL 3.0 2.2     There is no height or weight on file to calculate BMI.  Orders:  Orders Placed This Encounter  Procedures   MR Knee Left w/o contrast   No orders of the defined types were placed in this encounter.    Procedures: No procedures performed  Clinical Data: No additional findings.  ROS:  All other systems negative, except as noted in the HPI. Review of Systems  All other systems reviewed and are negative.  Objective: Vital Signs: There were no vitals taken for this  visit.  Specialty Comments:  No specialty comments available.  PMFS History: Patient Active Problem List   Diagnosis Date Noted   Left knee pain 05/09/2021   Tension headache 10/26/2018   Cough 10/26/2018   Hypertriglyceridemia 10/26/2018   Contraceptive management 09/21/2014   Alpha-1-antitrypsin deficiency ([place]) 08/22/2014   Migraine with aura 08/22/2014   Fatty infiltration of liver 08/11/2013   Essential hypertension, benign 09/07/2012   Allergic rhinitis 08/05/2012   Past Medical History:  Diagnosis Date   Alpha-1-antitrypsin deficiency ([place])    Basal cell carcinoma 04/29/2008   Left medial infraocular   Dysplastic nevus 01/24/2009   Left 3rd distal toe, periungual. Moderate atypia, close to margin   Hypertension    Migraine    Sinus disease     Family History  Problem Relation Age of Onset   Hypertension Mother    Cancer Father        lung   Breast cancer Maternal Aunt     Past Surgical History:  Procedure Laterality Date   BREAST BIOPSY Right 2019   Benign   BREAST BIOPSY Left 2012   benign   COLONOSCOPY WITH PROPOFOL N/A 01/02/2021   Procedure: COLONOSCOPY WITH PROPOFOL;  Surgeon: Virgel Manifold, MD;  Location: [facility];  Service: Endoscopy;  Laterality: N/A;   NASAL SINUS SURGERY  2011   Dr. Kathyrn Sheriff   POLYPECTOMY  01/02/2021   Procedure: POLYPECTOMY;  Surgeon: Virgel Manifold, MD;  Location: [facility];  Service: Endoscopy;;   VAGINAL DELIVERY     1   WISDOM TOOTH EXTRACTION     Social History   Occupational History   Not on file  Tobacco Use   Smoking status: Never   Smokeless tobacco: Never  Vaping Use   Vaping Use: Never used  Substance and Sexual Activity   Alcohol use: No    Alcohol/week: 0.0 standard drinks   Drug use: No   Sexual activity: Not on file

## 2021-06-08 ENCOUNTER — Telehealth: Payer: Self-pay | Admitting: Orthopaedic Surgery

## 2021-06-08 NOTE — Telephone Encounter (Signed)
called pt rang 1x then dropped call, need to sch pt for post MRI after 06/20/21 with PW

## 2021-06-20 ENCOUNTER — Ambulatory Visit: Payer: BC Managed Care – PPO

## 2021-07-16 ENCOUNTER — Other Ambulatory Visit: Payer: Self-pay | Admitting: Family Medicine

## 2021-07-16 DIAGNOSIS — I10 Essential (primary) hypertension: Secondary | ICD-10-CM

## 2021-10-17 ENCOUNTER — Other Ambulatory Visit: Payer: Self-pay | Admitting: Family Medicine

## 2021-10-17 DIAGNOSIS — I10 Essential (primary) hypertension: Secondary | ICD-10-CM

## 2021-11-07 ENCOUNTER — Encounter: Payer: Self-pay | Admitting: Family Medicine

## 2021-11-07 ENCOUNTER — Ambulatory Visit: Payer: BC Managed Care – PPO | Admitting: Family Medicine

## 2021-11-07 VITALS — BP 125/80 | HR 74 | Temp 98.4°F | Ht 63.0 in | Wt 190.2 lb

## 2021-11-07 DIAGNOSIS — I10 Essential (primary) hypertension: Secondary | ICD-10-CM

## 2021-11-07 DIAGNOSIS — E8801 Alpha-1-antitrypsin deficiency: Secondary | ICD-10-CM | POA: Diagnosis not present

## 2021-11-07 DIAGNOSIS — E781 Pure hyperglyceridemia: Secondary | ICD-10-CM

## 2021-11-07 DIAGNOSIS — D242 Benign neoplasm of left breast: Secondary | ICD-10-CM | POA: Insufficient documentation

## 2021-11-07 LAB — COMPREHENSIVE METABOLIC PANEL
ALT: 45 U/L — ABNORMAL HIGH (ref 0–35)
AST: 28 U/L (ref 0–37)
Albumin: 4.5 g/dL (ref 3.5–5.2)
Alkaline Phosphatase: 52 U/L (ref 39–117)
BUN: 11 mg/dL (ref 6–23)
CO2: 28 mEq/L (ref 19–32)
Calcium: 10.1 mg/dL (ref 8.4–10.5)
Chloride: 103 mEq/L (ref 96–112)
Creatinine, Ser: 0.83 mg/dL (ref 0.40–1.20)
GFR: 84.35 mL/min (ref >60.00)
Glucose, Bld: 99 mg/dL (ref 70–99)
Potassium: 3.8 mEq/L (ref 3.5–5.1)
Sodium: 139 mEq/L (ref 135–145)
Total Bilirubin: 0.6 mg/dL (ref 0.2–1.2)
Total Protein: 7.2 g/dL (ref 6.0–8.3)

## 2021-11-07 LAB — LIPID PANEL
Cholesterol: 166 mg/dL (ref 0–200)
HDL: 38.8 mg/dL — ABNORMAL LOW (ref >39.00)
LDL Cholesterol: 90 mg/dL (ref 0–99)
NonHDL: 127.69
Total CHOL/HDL Ratio: 4
Triglycerides: 189 mg/dL — ABNORMAL HIGH (ref 0.0–149.0)
VLDL: 37.8 mg/dL (ref 0.0–40.0)

## 2021-11-07 LAB — HEMOGLOBIN A1C: Hgb A1c MFr Bld: 5.5 % (ref 4.6–6.5)

## 2021-11-07 NOTE — Progress Notes (Signed)
  Tommi Rumps, MD Phone: [phone]  Barbara Ortega is a 46 y.o. female who presents today for f/u.  HYPERTENSION Disease Monitoring Home BP Monitoring not checking Chest pain- no    Dyspnea- no Medications Compliance-  taking HCTZ.   Edema- no BMET    Component Value Date/Time   NA 138 11/17/2020 1352   K 4.1 11/17/2020 1352   CL 101 11/17/2020 1352   CO2 28 11/17/2020 1352   GLUCOSE 80 11/17/2020 1352   BUN 16 11/17/2020 1352   CREATININE 0.81 11/17/2020 1352   CALCIUM 9.9 11/17/2020 1352   Alpha-1 antitrypsin deficiency: Patient previously followed with GI.  She has not seen them in some time.  She notes no issues with coughing or wheezing or shortness of breath.  Social History   Tobacco Use  Smoking Status Never  Smokeless Tobacco Never    Current Outpatient Medications on File Prior to Visit  Medication Sig Dispense Refill   cetirizine (ZYRTEC) 10 MG tablet Take 10 mg by mouth daily.     hydrochlorothiazide (MICROZIDE) 12.5 MG capsule TAKE 1 CAPSULE(12.5 MG) BY MOUTH DAILY 90 capsule 1   levonorgestrel (MIRENA) 20 MCG/24HR IUD 1 each by Intrauterine route once.     Multiple Vitamin (MULTIVITAMIN) tablet Take 1 tablet by mouth daily.     No current facility-administered medications on file prior to visit.     ROS see history of present illness  Objective  Physical Exam Vitals:   11/07/21 1009  BP: 125/80  Pulse: 74  Temp: 98.4 F (36.9 C)  SpO2: 98%    BP Readings from Last 3 Encounters:  11/07/21 125/80  05/09/21 121/70  03/15/21 (!) 142/86   Wt Readings from Last 3 Encounters:  11/07/21 190 lb 3.2 oz (86.3 kg)  05/09/21 186 lb 12.8 oz (84.7 kg)  01/02/21 185 lb 6.4 oz (84.1 kg)    Physical Exam Constitutional:      General: She is not in acute distress.    Appearance: She is not diaphoretic.  Cardiovascular:     Rate and Rhythm: Normal rate and regular rhythm.     Heart sounds: Normal heart sounds.  Pulmonary:     Effort:  Pulmonary effort is normal.     Breath sounds: Normal breath sounds.  Skin:    General: Skin is warm and dry.  Neurological:     Mental Status: She is alert.      Assessment/Plan: Please see individual problem list.  Problem List Items Addressed This Visit     Alpha-1-antitrypsin deficiency ([place]) (Chronic)    No pulmonary symptoms.  I encouraged her to follow-up with GI at [facility].      Essential hypertension, benign (Chronic)    Adequately controlled.  Discussed trying to check at home 2-3 times a week.  She will continue HCTZ 12.5 mg daily.  Lab work as outlined.      Relevant Orders   Comp Met (CMET)   Lipid panel   HgB A1c   Hypertriglyceridemia - Primary   Relevant Orders   Comp Met (CMET)   Lipid panel   HgB A1c     Health Maintenance: Patient reports her gynecology appointment is in August.  I have asked that she request her records from that visit be sent to [place].  Return in about 6 months (around 05/09/2022) for htn.   Tommi Rumps, MD [place]

## 2021-11-07 NOTE — Assessment & Plan Note (Signed)
Adequately controlled.  Discussed trying to check at home 2-3 times a week.  She will continue HCTZ 12.5 mg daily.  Lab work as outlined.

## 2021-11-07 NOTE — Assessment & Plan Note (Signed)
No pulmonary symptoms.  I encouraged her to follow-up with GI at [facility].

## 2021-11-08 ENCOUNTER — Telehealth: Payer: Self-pay | Admitting: Family Medicine

## 2021-11-08 NOTE — Telephone Encounter (Signed)
Patient returned office phone call for lab results. 

## 2021-11-09 NOTE — Telephone Encounter (Signed)
Just FYI lab results given to patient & were no longer in Barbara Ortega's box. Pt stated that she would follow up with liver specialist that she has been seeing. Also she had not had any recent alcohol or tylenol prior to labs.

## 2021-11-13 ENCOUNTER — Ambulatory Visit: Payer: BC Managed Care – PPO | Admitting: Dermatology

## 2021-11-26 ENCOUNTER — Other Ambulatory Visit: Payer: Self-pay | Admitting: Family Medicine

## 2021-11-26 DIAGNOSIS — Z1231 Encounter for screening mammogram for malignant neoplasm of breast: Secondary | ICD-10-CM

## 2021-12-06 ENCOUNTER — Ambulatory Visit: Payer: BC Managed Care – PPO | Admitting: Orthopaedic Surgery

## 2021-12-06 ENCOUNTER — Encounter: Payer: Self-pay | Admitting: Orthopaedic Surgery

## 2021-12-06 DIAGNOSIS — G8929 Other chronic pain: Secondary | ICD-10-CM

## 2021-12-06 DIAGNOSIS — M25562 Pain in left knee: Secondary | ICD-10-CM

## 2021-12-06 MED ORDER — METHYLPREDNISOLONE ACETATE 40 MG/ML IJ SUSP
80.0000 mg | INTRAMUSCULAR | Status: AC | PRN
  Administered 2021-12-06: 80 mg via INTRA_ARTICULAR

## 2021-12-06 MED ORDER — LIDOCAINE HCL 1 % IJ SOLN
2.0000 mL | INTRAMUSCULAR | Status: AC | PRN
  Administered 2021-12-06: 2 mL

## 2021-12-06 MED ORDER — BUPIVACAINE HCL 0.25 % IJ SOLN
2.0000 mL | INTRAMUSCULAR | Status: AC | PRN
  Administered 2021-12-06: 2 mL via INTRA_ARTICULAR

## 2021-12-06 NOTE — Progress Notes (Signed)
Office Visit Note   Patient: Barbara Ortega           Date of Birth: 1975/06/07           MRN: [id number] Visit Date: 12/06/2021              Requested by: Leone Haven, MD [address] [place] [place],  [place] [address] PCP: Leone Haven, MD   Assessment & Plan: Visit Diagnoses:  1. Chronic pain of left knee     Plan: Ms. Milliner was seen earlier in the year for evaluation of left knee pain.  X-rays were obtained demonstrating some degenerative changes as I review the films and seems that they are more prevalent in the lateral than the medial compartment there is some irregularity along the joint surface and some subchondral sclerosis.  She has tried medicine and exercises but notes that she is having more trouble.  Her pain is localized along the lateral compartment.  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  I think it is worth starting with a cortisone injection.  The cause for an MRI scan was fairly significant and she like to hold off on that if possible.  Like to see her back in 3 to 4 weeks she still having a problem.  Follow-Up Instructions: Return if symptoms worsen or fail to improve.   Orders:  No orders of the defined types were placed in this encounter.  No orders of the defined types were placed in this encounter.     Procedures: Large Joint Inj: L knee on 12/06/2021 2:17 PM Indications: pain and diagnostic evaluation Details: 25 G 1.5 in needle, anteromedial approach  Arthrogram: No  Medications: 2 mL lidocaine 1 %; 80 mg methylPREDNISolone acetate 40 MG/ML; 2 mL bupivacaine 0.25 % Procedure, treatment alternatives, risks and benefits explained, specific risks discussed. Consent was given by the patient. Patient was prepped and draped in the usual sterile fashion.       Clinical Data: No additional findings.   Subjective: Chief Complaint  Patient presents with   Left Knee - Pain  Patient  presents today for left knee pain. She said that she continues to have pain since her last visit in January. She said that she attempted to get an MRI but it was to expensive. She is wanting to try a cortisone injection today. She is not diabetic.  HPI  Review of Systems   Objective: Vital Signs: There were no vitals taken for this visit.  Physical Exam Constitutional:      Appearance: She is well-developed.  Eyes:     Pupils: Pupils are equal, round, and reactive to light.  Pulmonary:     Effort: Pulmonary effort is normal.  Skin:    General: Skin is warm and dry.  Neurological:     Mental Status: She is alert and oriented to person, place, and time.  Psychiatric:        Behavior: Behavior normal.     Ortho Exam awake alert and oriented x3.  Comfortable sitting.  Left knee was not hot red warm or swollen.  No effusion.  Has lateral joint pain without crepitation.  Walks without a limp.  Alignment appears to be neutral to very minimal valgus.  Painless range of motion of the hip.  No calf pain.  No popliteal pain neurologically intact  Specialty Comments:  No specialty comments available.  Imaging: No results  found.   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   Left knee pain 05/09/2021   Tension headache 10/26/2018   Cough 10/26/2018   Hypertriglyceridemia 10/26/2018   Hypertensive disorder 07/15/2018   Contraceptive management 09/21/2014   Alpha-1-antitrypsin deficiency ([place]) 08/22/2014   Migraine with aura 08/22/2014   Fatty infiltration of liver 08/11/2013   Essential hypertension, benign 09/07/2012   Allergic rhinitis 08/05/2012   Past Medical History:  Diagnosis Date   Alpha-1-antitrypsin deficiency ([place])    Basal cell carcinoma 04/29/2008   Left medial infraocular   Dysplastic nevus 01/24/2009   Left 3rd distal toe, periungual. Moderate atypia, close to margin   Hypertension    Migraine     Sinus disease     Family History  Problem Relation Age of Onset   Hypertension Mother    Cancer Father        lung   Breast cancer Maternal Aunt     Past Surgical History:  Procedure Laterality Date   BREAST BIOPSY Right 2019   Benign   BREAST BIOPSY Left 2012   benign   COLONOSCOPY WITH PROPOFOL N/A 01/02/2021   Procedure: COLONOSCOPY WITH PROPOFOL;  Surgeon: Virgel Manifold, MD;  Location: [facility];  Service: Endoscopy;  Laterality: N/A;   NASAL SINUS SURGERY  2011   Dr. Kathyrn Sheriff   POLYPECTOMY  01/02/2021   Procedure: POLYPECTOMY;  Surgeon: Virgel Manifold, MD;  Location: [facility];  Service: Endoscopy;;   VAGINAL DELIVERY     1   WISDOM TOOTH EXTRACTION     Social History   Occupational History   Not on file  Tobacco Use   Smoking status: Never   Smokeless tobacco: Never  Vaping Use   Vaping Use: Never used  Substance and Sexual Activity   Alcohol use: No    Alcohol/week: 0.0 standard drinks of alcohol   Drug use: No   Sexual activity: Not on file

## 2022-01-07 ENCOUNTER — Ambulatory Visit
Admission: RE | Admit: 2022-01-07 | Discharge: 2022-01-07 | Disposition: A | Discharge status: Home / Self Care | Payer: BC Managed Care – PPO | Source: Ambulatory Visit | Attending: Family Medicine | Admitting: Family Medicine

## 2022-01-07 DIAGNOSIS — Z1231 Encounter for screening mammogram for malignant neoplasm of breast: Secondary | ICD-10-CM

## 2022-01-08 ENCOUNTER — Ambulatory Visit: Payer: BC Managed Care – PPO | Admitting: Dermatology

## 2022-04-23 ENCOUNTER — Other Ambulatory Visit: Payer: Self-pay | Admitting: Family Medicine

## 2022-04-23 DIAGNOSIS — I10 Essential (primary) hypertension: Secondary | ICD-10-CM

## 2022-05-10 ENCOUNTER — Ambulatory Visit: Payer: BC Managed Care – PPO | Admitting: Family Medicine

## 2022-05-20 ENCOUNTER — Telehealth: Payer: BC Managed Care – PPO | Admitting: Physician Assistant

## 2022-05-20 ENCOUNTER — Telehealth: Payer: BC Managed Care – PPO

## 2022-05-20 DIAGNOSIS — J019 Acute sinusitis, unspecified: Secondary | ICD-10-CM

## 2022-05-20 DIAGNOSIS — B9689 Other specified bacterial agents as the cause of diseases classified elsewhere: Secondary | ICD-10-CM

## 2022-05-20 MED ORDER — DOXYCYCLINE HYCLATE 100 MG PO TABS: 100.0000 mg | ORAL_TABLET | Freq: Two times a day (BID) | ORAL | 0 refills | Status: DC

## 2022-05-20 MED ORDER — PREDNISONE 20 MG PO TABS: 40.0000 mg | ORAL_TABLET | Freq: Every day | ORAL | 0 refills | Status: DC

## 2022-05-20 NOTE — Patient Instructions (Signed)
Felecia Shelling, thank you for joining Leeanne Rio, PA-C for today's virtual visit.  While this provider is not your primary care provider (PCP), if your PCP is located in our provider database this encounter information will be shared with them immediately following your visit.   [facility] account gives you access to today's visit and all your visits, tests, and labs performed at [facility] " click here if you don't have a [facility] account or go to [email or web address]  Consent: (Patient) Barbara Ortega provided verbal consent for this virtual visit at the beginning of the encounter.  Current Medications:  Current Outpatient Medications:    cetirizine (ZYRTEC) 10 MG tablet, Take 10 mg by mouth daily., Disp: , Rfl:    hydrochlorothiazide (MICROZIDE) 12.5 MG capsule, TAKE 1 CAPSULE(12.5 MG) BY MOUTH DAILY, Disp: 90 capsule, Rfl: 1   levonorgestrel (MIRENA) 20 MCG/24HR IUD, 1 each by Intrauterine route once., Disp: , Rfl:    Multiple Vitamin (MULTIVITAMIN) tablet, Take 1 tablet by mouth daily., Disp: , Rfl:    Medications ordered in this encounter:  No orders of the defined types were placed in this encounter.    *If you need refills on other medications prior to your next appointment, please contact your pharmacy*  Follow-Up: Call back or seek an in-person evaluation if the symptoms worsen or if the condition fails to improve as anticipated.  [place] [phone]  Other Instructions Please take antibiotic as directed.  Increase fluid intake.  Use Saline nasal spray.  Take a daily multivitamin. Use your Flonase and Mucinex. Take the prednisone as directed.  Place a humidifier in the bedroom.  Please call or return clinic if symptoms are not improving.  Sinusitis Sinusitis is redness, soreness, and swelling (inflammation) of the paranasal sinuses. Paranasal sinuses are air pockets within the bones of your face  (beneath the eyes, the middle of the forehead, or above the eyes). In healthy paranasal sinuses, mucus is able to drain out, and air is able to circulate through them by way of your nose. However, when your paranasal sinuses are inflamed, mucus and air can become trapped. This can allow bacteria and other germs to grow and cause infection. Sinusitis can develop quickly and last only a short time (acute) or continue over a long period (chronic). Sinusitis that lasts for more than 12 weeks is considered chronic.  CAUSES  Causes of sinusitis include: Allergies. Structural abnormalities, such as displacement of the cartilage that separates your nostrils (deviated septum), which can decrease the air flow through your nose and sinuses and affect sinus drainage. Functional abnormalities, such as when the small hairs (cilia) that line your sinuses and help remove mucus do not work properly or are not present. SYMPTOMS  Symptoms of acute and chronic sinusitis are the same. The primary symptoms are pain and pressure around the affected sinuses. Other symptoms include: Upper toothache. Earache. Headache. Bad breath. Decreased sense of smell and taste. A cough, which worsens when you are lying flat. Fatigue. Fever. Thick drainage from your nose, which often is green and may contain pus (purulent). Swelling and warmth over the affected sinuses. DIAGNOSIS  Your caregiver will perform a physical exam. During the exam, your caregiver may: Look in your nose for signs of abnormal growths in your nostrils (nasal polyps). Tap over the affected sinus to check for signs of infection. View the inside of your sinuses (endoscopy) with a special imaging device with a light  attached (endoscope), which is inserted into your sinuses. If your caregiver suspects that you have chronic sinusitis, one or more of the following tests may be recommended: Allergy tests. Nasal culture A sample of mucus is taken from your nose  and sent to a lab and screened for bacteria. Nasal cytology A sample of mucus is taken from your nose and examined by your caregiver to determine if your sinusitis is related to an allergy. TREATMENT  Most cases of acute sinusitis are related to a viral infection and will resolve on their own within 10 days. Sometimes medicines are prescribed to help relieve symptoms (pain medicine, decongestants, nasal steroid sprays, or saline sprays).  However, for sinusitis related to a bacterial infection, your caregiver will prescribe antibiotic medicines. These are medicines that will help kill the bacteria causing the infection.  Rarely, sinusitis is caused by a fungal infection. In theses cases, your caregiver will prescribe antifungal medicine. For some cases of chronic sinusitis, surgery is needed. Generally, these are cases in which sinusitis recurs more than 3 times per year, despite other treatments. HOME CARE INSTRUCTIONS  Drink plenty of water. Water helps thin the mucus so your sinuses can drain more easily. Use a humidifier. Inhale steam 3 to 4 times a day (for example, sit in the bathroom with the shower running). Apply a warm, moist washcloth to your face 3 to 4 times a day, or as directed by your caregiver. Use saline nasal sprays to help moisten and clean your sinuses. Take over-the-counter or prescription medicines for pain, discomfort, or fever only as directed by your caregiver. SEEK IMMEDIATE MEDICAL CARE IF: You have increasing pain or severe headaches. You have nausea, vomiting, or drowsiness. You have swelling around your face. You have vision problems. You have a stiff neck. You have difficulty breathing. MAKE SURE YOU:  Understand these instructions. Will watch your condition. Will get help right away if you are not doing well or get worse. Document Released: 05/13/2005 Document Revised: 08/05/2011 Document Reviewed: 05/28/2011 [facility] Patient Information 2014 [place],  [place].    If you have been instructed to have an in-person evaluation today at a local Urgent Care facility, please use the link below. It will take you to a list of all of our available [facility], including address, phone number and hours of operation. Please do not delay care.  [facility]  If you or a family member do not have a primary care provider, use the link below to schedule a visit and establish care. When you choose a [facility] primary care physician or advanced practice provider, you gain a long-term partner in health. Find a Primary Care Provider  Learn more about [facility]'s in-office and virtual care options: [facility]

## 2022-05-20 NOTE — Addendum Note (Signed)
Addended by: Brunetta Jeans on: 05/20/2022 10:47 AM   Modules accepted: Orders

## 2022-05-20 NOTE — Progress Notes (Signed)
Virtual Visit Consent   Barbara Ortega, you are scheduled for a virtual visit with a [facility] provider today. Just as with appointments in the office, your consent must be obtained to participate. Your consent will be active for this visit and any virtual visit you may have with one of our providers in the next 365 days. If you have a MyChart account, a copy of this consent can be sent to you electronically.  As this is a virtual visit, video technology does not allow for your provider to perform a traditional examination. This may limit your provider's ability to fully assess your condition. If your provider identifies any concerns that need to be evaluated in person or the need to arrange testing (such as labs, EKG, etc.), we will make arrangements to do so. Although advances in technology are sophisticated, we cannot ensure that it will always work on either your end or our end. If the connection with a video visit is poor, the visit may have to be switched to a telephone visit. With either a video or telephone visit, we are not always able to ensure that we have a secure connection.  By engaging in this virtual visit, you consent to the provision of healthcare and authorize for your insurance to be billed (if applicable) for the services provided during this visit. Depending on your insurance coverage, you may receive a charge related to this service.  I need to obtain your verbal consent now. Are you willing to proceed with your visit today? Barbara Ortega has provided verbal consent on 05/20/2022 for a virtual visit (video or telephone). Leeanne Rio, [place]  Date: 05/20/2022 9:51 AM  Virtual Visit via Video Note   I, Leeanne Rio, connected with  Barbara Ortega  ([id number], [birth year]) on 05/20/22 at  9:45 AM EST by a video-enabled telemedicine application and verified that I am speaking with the correct person using two identifiers.  Location: Patient: Virtual Visit  Location Patient: Home Provider: Virtual Visit Location Provider: Home Office   I discussed the limitations of evaluation and management by telemedicine and the availability of in person appointments. The patient expressed understanding and agreed to proceed.    History of Present Illness: Barbara Ortega is a 46 y.o. who identifies as a female who was assigned female at birth, and is being seen today for possible sinus infection. Patient endorses URI symptoms started 2 weeks ago at which time multiple family members were sick as well. Symptoms were severe for about 5 days and then improved but without resolving. Has been continued since then and worsening over past several days with some associated significant sinus pressure/pain and difficulty breathing through her nose. Denies fever, chills at this point. Cough without chest congestion. Notes facial pain.   Dayquil and Mucinex. Also Flonase OTC.   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   Left knee pain 05/09/2021   Tension headache 10/26/2018   Cough 10/26/2018   Hypertriglyceridemia 10/26/2018   Hypertensive disorder 07/15/2018   Contraceptive management 09/21/2014   Alpha-1-antitrypsin deficiency ([place]) 08/22/2014   Migraine with aura 08/22/2014   Fatty infiltration of liver 08/11/2013   Essential hypertension, benign 09/07/2012   Allergic rhinitis 08/05/2012    Allergies:  Allergies  Allergen Reactions   Elemental Sulfur Hives   Medications:  Current Outpatient Medications:    doxycycline (VIBRA-TABS) 100 MG tablet, Take 1 tablet (  100 mg total) by mouth 2 (two) times daily., Disp: 20 tablet, Rfl: 0   predniSONE (DELTASONE) 20 MG tablet, Take 2 tablets (40 mg total) by mouth daily with breakfast., Disp: 10 tablet, Rfl: 0   cetirizine (ZYRTEC) 10 MG tablet, Take 10 mg by mouth daily., Disp: , Rfl:    hydrochlorothiazide (MICROZIDE) 12.5 MG capsule,  TAKE 1 CAPSULE(12.5 MG) BY MOUTH DAILY, Disp: 90 capsule, Rfl: 1   levonorgestrel (MIRENA) 20 MCG/24HR IUD, 1 each by Intrauterine route once., Disp: , Rfl:    Multiple Vitamin (MULTIVITAMIN) tablet, Take 1 tablet by mouth daily., Disp: , Rfl:   Observations/Objective: Patient is well-developed, well-nourished in no acute distress.  Resting comfortably at home.  Head is normocephalic, atraumatic.  No labored breathing. Speech is clear and coherent with logical content.  Patient is alert and oriented at baseline.   Assessment and Plan: 1. Acute bacterial sinusitis - predniSONE (DELTASONE) 20 MG tablet; Take 2 tablets (40 mg total) by mouth daily with breakfast.  Dispense: 10 tablet; Refill: 0 - doxycycline (VIBRA-TABS) 100 MG tablet; Take 1 tablet (100 mg total) by mouth 2 (two) times daily.  Dispense: 20 tablet; Refill: 0  Rx doxycycline.  Increase fluids.  Rest.  Saline nasal spray.  Probiotic.  Mucinex as directed.  Humidifier in bedroom. Giving level of congestion despite use of nasal steroid spray and OTC mucolytics/decongestants, will give short course of prednisone.  Call or return to clinic if symptoms are not improving.   Follow Up Instructions: I discussed the assessment and treatment plan with the patient. The patient was provided an opportunity to ask questions and all were answered. The patient agreed with the plan and demonstrated an understanding of the instructions.  A copy of instructions were sent to the patient via MyChart unless otherwise noted below.   The patient was advised to call back or seek an in-person evaluation if the symptoms worsen or if the condition fails to improve as anticipated.  Time:  I spent 10 minutes with the patient via telehealth technology discussing the above problems/concerns.    Leeanne Rio, PA-C

## 2022-05-27 ENCOUNTER — Ambulatory Visit (INDEPENDENT_AMBULATORY_CARE_PROVIDER_SITE_OTHER): Payer: BC Managed Care – PPO

## 2022-05-27 ENCOUNTER — Ambulatory Visit
Admission: EM | Admit: 2022-05-27 | Discharge: 2022-05-27 | Disposition: A | Discharge status: Home / Self Care | Payer: BC Managed Care – PPO | Attending: Physician Assistant | Admitting: Physician Assistant

## 2022-05-27 DIAGNOSIS — R051 Acute cough: Secondary | ICD-10-CM

## 2022-05-27 DIAGNOSIS — J019 Acute sinusitis, unspecified: Secondary | ICD-10-CM

## 2022-05-27 DIAGNOSIS — R0989 Other specified symptoms and signs involving the circulatory and respiratory systems: Secondary | ICD-10-CM

## 2022-05-27 DIAGNOSIS — R059 Cough, unspecified: Secondary | ICD-10-CM | POA: Diagnosis not present

## 2022-05-27 DIAGNOSIS — R519 Headache, unspecified: Secondary | ICD-10-CM

## 2022-05-27 MED ORDER — AMOXICILLIN-POT CLAVULANATE 875-125 MG PO TABS: 1.0000 | ORAL_TABLET | Freq: Two times a day (BID) | ORAL | 0 refills | Status: AC

## 2022-05-27 NOTE — Discharge Instructions (Addendum)
-  Normal chest x-ray.  As we discussed, you do not have pneumonia.  You may have a bacteria that did not respond to doxycycline.  Augmentin is first-line I think a work little better.  We also discussed possibility that this is all a viral illness.  If you have a viral illness and medicine to your chest, it can last for 4 to 6 weeks and this is a possibility. - Coricidin HBP over-the-counter, Flonase, rest and fluids. - Follow-up with PCP if no improvement in the next week or symptoms worsen.

## 2022-05-27 NOTE — ED Triage Notes (Signed)
Pt reports she have been sick the month of December. Pt reports short of breath, left side of face hurts, congestion.

## 2022-05-27 NOTE — ED Provider Notes (Signed)
Call [facility] URGENT CARE    CSN: [id number] Arrival date & time: 05/27/22  [id number]      History   Chief Complaint No chief complaint on file.   HPI Barbara Ortega is a 47 y.o. female presenting for 3-week history of cough and congestion.  Patient reports she started becoming ill on May 03, 2022.  She states her whole family had the flu at that time.  She says she started to improve and then became more ill around Christmas time.  She reports that she has been taking doxycycline for suspected sinusitis for the past 7 days and has 3 more days left of this medication.  Reports that she also has taken prednisone.  Reports she started to feel worse yesterday with shortness of breath and pain of the left maxillary sinus and left sided frontal headache.  She also reports increased fatigue.  States her nasal drainage is clear but her cough is productive of greenish sputum.  No fever.  Patient reports a history of sinusitis and has had previous sinus surgery.  Denies any history of cardiopulmonary disease.  Does have history of breast cancer.  Also history of alpha-1 antitrypsin deficiency.  No other complaints.  HPI  Past Medical History:  Diagnosis Date   Alpha-1-antitrypsin deficiency ([place])    Basal cell carcinoma 04/29/2008   Left medial infraocular   Dysplastic nevus 01/24/2009   Left 3rd distal toe, periungual. 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   Left knee pain 05/09/2021   Tension headache 10/26/2018   Cough 10/26/2018   Hypertriglyceridemia 10/26/2018   Hypertensive disorder 07/15/2018   Contraceptive management 09/21/2014   Alpha-1-antitrypsin deficiency ([place]) 08/22/2014   Migraine with aura 08/22/2014   Fatty infiltration of liver 08/11/2013   Essential hypertension, benign 09/07/2012   Allergic rhinitis 08/05/2012    Past  Surgical History:  Procedure Laterality Date   BREAST BIOPSY Right 2019   Benign   BREAST BIOPSY Left 2012   benign   COLONOSCOPY WITH PROPOFOL N/A 01/02/2021   Procedure: COLONOSCOPY WITH PROPOFOL;  Surgeon: Virgel Manifold, MD;  Location: [facility];  Service: Endoscopy;  Laterality: N/A;   NASAL SINUS SURGERY  2011   Dr. Kathyrn Sheriff   POLYPECTOMY  01/02/2021   Procedure: POLYPECTOMY;  Surgeon: Virgel Manifold, MD;  Location: [facility];  Service: Endoscopy;;   VAGINAL DELIVERY     1   WISDOM TOOTH EXTRACTION      OB History   No obstetric history on file.      Home Medications    Prior to Admission medications   Medication Sig Start Date End Date Taking? Authorizing Provider  amoxicillin-clavulanate (AUGMENTIN) 875-125 MG tablet Take 1 tablet by mouth every 12 (twelve) hours for 7 days. 05/27/22 06/03/22 Yes Danton Clap, PA-C  cetirizine (ZYRTEC) 10 MG tablet Take 10 mg by mouth daily.    [provider]  hydrochlorothiazide (MICROZIDE) 12.5 MG capsule TAKE 1 CAPSULE(12.5 MG) BY MOUTH DAILY 04/23/22   Leone Haven, MD  levonorgestrel (MIRENA) 20 MCG/24HR IUD 1 each by Intrauterine route once.    [provider]  Multiple Vitamin (MULTIVITAMIN) tablet Take 1 tablet by mouth daily.    [provider]  predniSONE (DELTASONE) 20 MG tablet Take 2 tablets (40 mg total) by mouth  daily with breakfast. 05/20/22   Brunetta Jeans, PA-C    Family History Family History  Problem Relation Age of Onset   Hypertension Mother    Cancer Father        lung   Breast cancer Maternal Aunt     Social History Social History   Tobacco Use   Smoking status: Never   Smokeless tobacco: Never  Vaping Use   Vaping Use: Never used  Substance Use Topics   Alcohol use: No    Alcohol/week: 0.0 standard drinks of alcohol   Drug use: No     Allergies   Elemental sulfur   Review of Systems Review of Systems  Constitutional:   Positive for fatigue. Negative for chills, diaphoresis and fever.  HENT:  Positive for congestion, ear pain, postnasal drip, rhinorrhea, sinus pressure and sinus pain. Negative for sore throat.   Respiratory:  Positive for cough and shortness of breath. Negative for wheezing.   Cardiovascular:  Negative for chest pain.  Gastrointestinal:  Negative for abdominal pain, nausea and vomiting.  Musculoskeletal:  Negative for arthralgias and myalgias.  Skin:  Negative for rash.  Neurological:  Positive for headaches. Negative for weakness.  Hematological:  Negative for adenopathy.     Physical Exam Triage Vital Signs ED Triage Vitals  Enc Vitals Group     BP 03/15/21 0850 (!) 142/86     Pulse Rate 03/15/21 0850 90     Resp 03/15/21 0850 16     Temp 03/15/21 0850 99.4 F (37.4 C)     Temp Source 03/15/21 0850 Oral     SpO2 03/15/21 0850 96 %     Weight --      Height --      Head Circumference --      Peak Flow --      Pain Score 03/15/21 0847 5     Pain Loc --      Pain Edu? --      Excl. in [place]? --    No data found.  Updated Vital Signs BP (!) 158/114 (BP Location: Left Arm)   Pulse 76   Temp 98.2 F (36.8 C)   Resp 16   Ht '5\' 3"'$  (1.6 m)   Wt 186 lb (84.4 kg)   SpO2 100%   BMI 32.95 kg/m   Physical Exam Vitals and nursing note reviewed.  Constitutional:      General: She is not in acute distress.    Appearance: Normal appearance. She is not ill-appearing or toxic-appearing.  HENT:     Head: Normocephalic and atraumatic.     Right Ear: Ear canal and external ear normal. A middle ear effusion is present.     Left Ear: Ear canal and external ear normal. A middle ear effusion is present.     Nose: Congestion present.     Mouth/Throat:     Mouth: Mucous membranes are moist.     Pharynx: Oropharynx is clear. Posterior oropharyngeal erythema (mild with clear PND) present.  Eyes:     General: No scleral icterus.       Right eye: No discharge.        Left eye: No  discharge.     Conjunctiva/sclera: Conjunctivae normal.  Cardiovascular:     Rate and Rhythm: Normal rate and regular rhythm.     Heart sounds: Normal heart sounds.  Pulmonary:     Effort: Pulmonary effort is normal. No respiratory distress.     Breath sounds:  Normal breath sounds. No wheezing, rhonchi or rales.  Musculoskeletal:     Cervical back: Neck supple.  Skin:    General: Skin is dry.  Neurological:     General: No focal deficit present.     Mental Status: She is alert. Mental status is at baseline.     Motor: No weakness.     Gait: Gait normal.  Psychiatric:        Mood and Affect: Mood normal.        Behavior: Behavior normal.        Thought Content: Thought content normal.      UC Treatments / Results  Labs (all labs ordered are listed, but only abnormal results are displayed) Labs Reviewed - No data to display   EKG   Radiology DG Chest 2 View  Result Date: 05/27/2022 CLINICAL DATA:  Cough and congestion for 1 month. EXAM: CHEST - 2 VIEW COMPARISON:  None Available. FINDINGS: Heart size and mediastinal contours are unremarkable. There is no pleural effusion or edema identified. No airspace opacities identified. Visualized osseous structures are unremarkable. IMPRESSION: No active cardiopulmonary disease. Electronically Signed   By: Kerby Moors M.D.   On: 05/27/2022 09:32    Procedures Procedures (including critical care time)  Medications Ordered in UC Medications - No data to display  Initial Impression / Assessment and Plan / UC Course  I have reviewed the triage vital signs and the nursing notes.  Pertinent labs & imaging results that were available during my care of the patient were reviewed by me and considered in my medical decision making (see chart for details).  47 year old female presenting for 3-week history of cough and congestion.  Has completed course of prednisone and 7 days of doxycycline without relief and states symptoms worsened  yesterday with left-sided facial pain and headaches beginning as well as shortness of breath.  No associated fever.  Vitals normal and stable patient is overall well-appearing.  Exam significant for minor mid ear effusions of TMs, nasal congestion, mild posterior pharyngeal erythema and clear visible drainage.  Chest clear to auscultation heart regular rate and rhythm.  Chest x-ray obtained to rule out pneumonia.  X-ray normal.  Discussed result with patient.  Advised patient she may have sinusitis that did not respond to doxycycline.  Augmentin is first-line.  Will try that at this time.  Will have her stop taking doxycycline.  She only has 3 days in the course left.  Also advised her of the possibility that this is a viral illness which can last for 4 to 6 weeks.  Advised to return if fever or worsening symptoms, otherwise follow-up with PCP.  Work note given.  Final Clinical Impressions(s) / UC Diagnoses   Final diagnoses:  Acute sinusitis, recurrence not specified, unspecified location  Facial pain  Acute cough     Discharge Instructions      -Normal chest x-ray.  As we discussed, you do not have pneumonia.  You may have a bacteria that did not respond to doxycycline.  Augmentin is first-line I think a work little better.  We also discussed possibility that this is all a viral illness.  If you have a viral illness and medicine to your chest, it can last for 4 to 6 weeks and this is a possibility. - Coricidin HBP over-the-counter, Flonase, rest and fluids. - Follow-up with PCP if no improvement in the next week or symptoms worsen.       ED Prescriptions  Medication Sig Dispense Auth. Provider   amoxicillin-clavulanate (AUGMENTIN) 875-125 MG tablet Take 1 tablet by mouth every 12 (twelve) hours for 7 days. 14 tablet Gretta Cool      PDMP not reviewed this encounter.     Danton Clap, PA-C 05/27/22 [phone]

## 2022-06-17 ENCOUNTER — Ambulatory Visit: Payer: BC Managed Care – PPO | Admitting: Family Medicine

## 2022-06-17 VITALS — BP 118/80 | HR 81 | Temp 98.6°F | Ht 63.0 in | Wt 182.2 lb

## 2022-06-17 DIAGNOSIS — I10 Essential (primary) hypertension: Secondary | ICD-10-CM

## 2022-06-17 DIAGNOSIS — J309 Allergic rhinitis, unspecified: Secondary | ICD-10-CM | POA: Diagnosis not present

## 2022-06-17 DIAGNOSIS — R232 Flushing: Secondary | ICD-10-CM | POA: Diagnosis not present

## 2022-06-17 DIAGNOSIS — E8801 Alpha-1-antitrypsin deficiency: Secondary | ICD-10-CM

## 2022-06-17 MED ORDER — AZELASTINE HCL 0.1 % NA SOLN: 2.0000 | Freq: Two times a day (BID) | NASAL | 12 refills | Status: DC

## 2022-06-17 NOTE — Assessment & Plan Note (Addendum)
Control cannot be determined at this time. Patient has been taking Sudafed for her sinus symptoms and this may be increasing her blood pressures at home. We will trial Astelin spray to see if this can give her relief so she can stop taking the Sudafed. She will report back her blood pressures once she discontinues the Sudafed. She will continue to take HCTZ 12.'5mg'$  daily.

## 2022-06-17 NOTE — Patient Instructions (Signed)
Nice to see you. Will contact you with your lab results. Please try the Astelin nasal spray along with your Flonase and switching the Zyrtec to Allegra or Xyzal to see if that will help with your sinus issues.

## 2022-06-17 NOTE — Assessment & Plan Note (Signed)
Could be due to onset of menopause or less likely a prolonged series of hot flashes from her prednisone course a month ago. We will check TSH and FSH today. If patient is in menopause or has hyperthyroidism we will treat accordingly.

## 2022-06-17 NOTE — Progress Notes (Signed)
Tommi Rumps, MD Phone: [phone]  Barbara Ortega is a 47 y.o. female who presents today for f/u.  Hypertension: Patient currently takes HCTZ 12.'5mg'$  daily. She checks her blood pressures at home and recently they have been ranging in the 120s-130s/90-94. Patient reports that she's been taking Sudafed frequently due to having multiple bouts of respiratory illnesses during the holiday season, and she wonders if the Sudafed is contributing to her higher diastolic numbers. She denies any chest pain, shortness of breath, or edema.  Hot flashes: patient was started on prednisone '40mg'$  daily for bacterial sinusitis back on Christmas day, and after five days of therapy patient began experiencing hot flashes. The flashes will last five to ten minutes during the day and she will feel hot all the way down her body. They start out centrally and build outward. At night the hot flashes will last longer and she will typically have accompanying insomnia along with sweating. She has an IUD in place so she cannot remember the last time she had a menstrual cycle. She denies any vaginal dryness, vaginal bleeding, dyspareunia, and mood swings. Patient is wondering if her hot flashes are related to her recent prednisone course or if she is starting menopause.    Social History   Tobacco Use  Smoking Status Never  Smokeless Tobacco Never    Current Outpatient Medications on File Prior to Visit  Medication Sig Dispense Refill   cetirizine (ZYRTEC) 10 MG tablet Take 10 mg by mouth daily.     hydrochlorothiazide (MICROZIDE) 12.5 MG capsule TAKE 1 CAPSULE(12.5 MG) BY MOUTH DAILY 90 capsule 1   levonorgestrel (MIRENA) 20 MCG/24HR IUD 1 each by Intrauterine route once.     Multiple Vitamin (MULTIVITAMIN) tablet Take 1 tablet by mouth daily.     No current facility-administered medications on file prior to visit.     ROS see history of present illness  Objective  Physical Exam Vitals:   06/17/22 1558   BP: 118/80  Pulse: 81  Temp: 98.6 F (37 C)  SpO2: 97%    BP Readings from Last 3 Encounters:  06/17/22 118/80  05/27/22 (!) 158/114  11/07/21 125/80   Wt Readings from Last 3 Encounters:  06/17/22 182 lb 3.2 oz (82.6 kg)  05/27/22 186 lb (84.4 kg)  11/07/21 190 lb 3.2 oz (86.3 kg)    Physical Exam Constitutional:      Appearance: Normal appearance.  HENT:     Head: Normocephalic and atraumatic.     Right Ear: Hearing, tympanic membrane, ear canal and external ear normal.     Left Ear: Hearing, tympanic membrane, ear canal and external ear normal.  Neck:     Thyroid: No thyromegaly or thyroid tenderness.  Cardiovascular:     Rate and Rhythm: Normal rate and regular rhythm.  Pulmonary:     Effort: Pulmonary effort is normal.     Breath sounds: Normal breath sounds.  Skin:    General: Skin is warm and dry.  Neurological:     Mental Status: She is alert.  Psychiatric:        Mood and Affect: Mood normal.     Assessment/Plan: Please see individual problem list.  Problem List Items Addressed This Visit     Allergic rhinitis (Chronic)    Chronic issue.  Suspect the patient's current symptoms are related to allergies.  Advised to stop Sudafed.  We will trial Astelin 2 sprays each nostril twice daily as well as having her start back on Flonase  consistently.  She can continue second-generation antihistamine.      Essential hypertension, benign (Chronic)    Control cannot be determined at this time. Patient has been taking Sudafed for her sinus symptoms and this may be increasing her blood pressures at home. We will trial Astelin spray to see if this can give her relief so she can stop taking the Sudafed. She will report back her blood pressures once she discontinues the Sudafed. She will continue to take HCTZ 12.'5mg'$  daily.      Hot flashes - Primary    Could be due to onset of menopause or less likely a prolonged series of hot flashes from her prednisone course a month  ago. We will check TSH and FSH today. If patient is in menopause or has hyperthyroidism we will treat accordingly.       Relevant Orders   FSH   TSH      Return in about 3 months (around 09/16/2022) for htn.   Marisa Cyphers, Medical Student [place]

## 2022-06-17 NOTE — Assessment & Plan Note (Deleted)
No pulmonary or GI symptoms at this time. Encouraged her to follow up with her GI doctor as needed.

## 2022-06-18 LAB — TSH: TSH: 1.92 u[IU]/mL (ref 0.35–5.50)

## 2022-06-18 LAB — FOLLICLE STIMULATING HORMONE: FSH: 52.6 m[IU]/mL

## 2022-06-18 NOTE — Assessment & Plan Note (Addendum)
Chronic issue.  Suspect the patient's current symptoms are related to allergies.  Advised to stop Sudafed.  We will trial Astelin 2 sprays each nostril twice daily as well as having her start back on Flonase consistently.  She can continue second-generation antihistamine.

## 2022-06-18 NOTE — Progress Notes (Signed)
Patient seen along with medical student Zada Zhong.  I personally evaluated this patient along with the student, and verified all aspects of the history, physical exam, and medical decision making as documented by the student.  I agree with the student's documentation and have made all necessary edits.  Chaniya Genter, MD  

## 2022-06-19 ENCOUNTER — Other Ambulatory Visit: Payer: Self-pay | Admitting: Family Medicine

## 2022-06-19 DIAGNOSIS — R232 Flushing: Secondary | ICD-10-CM

## 2022-06-19 MED ORDER — GABAPENTIN 300 MG PO CAPS: 300.0000 mg | ORAL_CAPSULE | Freq: Every day | ORAL | 3 refills | Status: DC

## 2022-08-05 ENCOUNTER — Encounter: Payer: Self-pay | Admitting: Family Medicine

## 2022-08-07 NOTE — Progress Notes (Unsigned)
  Tomasita Morrow, NP-C Phone: [phone]  Barbara Ortega is a 47 y.o. female who presents today for hypertension.  Last 5 readings were:  154/107 126/92 120/96 142/106 142/99 150/101  HYPERTENSION Disease Monitoring Home BP Monitoring- See above Chest pain- ***    Dyspnea- *** Medications Compliance-  ***. Lightheadedness-  ***  Edema- *** BMET    Component Value Date/Time   NA 139 11/07/2021 1015   K 3.8 11/07/2021 1015   CL 103 11/07/2021 1015   CO2 28 11/07/2021 1015   GLUCOSE 99 11/07/2021 1015   BUN 11 11/07/2021 1015   CREATININE 0.83 11/07/2021 1015   CALCIUM 10.1 11/07/2021 1015     Social History   Tobacco Use  Smoking Status Never  Smokeless Tobacco Never    Current Outpatient Medications on File Prior to Visit  Medication Sig Dispense Refill   azelastine (ASTELIN) 0.1 % nasal spray Place 2 sprays into both nostrils 2 (two) times daily. Use in each nostril as directed 30 mL 12   cetirizine (ZYRTEC) 10 MG tablet Take 10 mg by mouth daily.     gabapentin (NEURONTIN) 300 MG capsule Take 1 capsule (300 mg total) by mouth at bedtime. 90 capsule 3   hydrochlorothiazide (MICROZIDE) 12.5 MG capsule TAKE 1 CAPSULE(12.5 MG) BY MOUTH DAILY 90 capsule 1   levonorgestrel (MIRENA) 20 MCG/24HR IUD 1 each by Intrauterine route once.     Multiple Vitamin (MULTIVITAMIN) tablet Take 1 tablet by mouth daily.     No current facility-administered medications on file prior to visit.     ROS see history of present illness  Objective  Physical Exam There were no vitals filed for this visit.  BP Readings from Last 3 Encounters:  06/17/22 118/80  05/27/22 (!) 158/114  11/07/21 125/80   Wt Readings from Last 3 Encounters:  06/17/22 182 lb 3.2 oz (82.6 kg)  05/27/22 186 lb (84.4 kg)  11/07/21 190 lb 3.2 oz (86.3 kg)    Physical Exam   Assessment/Plan: Please see individual problem list.  There are no diagnoses linked to this encounter.   Health  Maintenance: ***  No follow-ups on file.   Tomasita Morrow, NP-C [place]

## 2022-08-07 NOTE — Telephone Encounter (Signed)
Patient is scheduled to see Tomasita Morrow on 08/08/22 at 8:20.

## 2022-08-08 ENCOUNTER — Ambulatory Visit: Payer: BC Managed Care – PPO | Admitting: Nurse Practitioner

## 2022-08-08 ENCOUNTER — Encounter: Payer: Self-pay | Admitting: Nurse Practitioner

## 2022-08-08 VITALS — BP 146/88 | HR 75 | Temp 98.3°F | Ht 63.0 in | Wt 184.2 lb

## 2022-08-08 DIAGNOSIS — I10 Essential (primary) hypertension: Secondary | ICD-10-CM | POA: Diagnosis not present

## 2022-08-08 MED ORDER — VALSARTAN-HYDROCHLOROTHIAZIDE 80-12.5 MG PO TABS: 1.0000 | ORAL_TABLET | Freq: Every day | ORAL | 2 refills | Status: DC

## 2022-08-08 NOTE — Assessment & Plan Note (Addendum)
Home BP readings reviewed, numbers continue to be elevated. Elevated reading x 2 in office today. Will add Valsartan 80 mg and change Rx to combined Valsartan/HCTZ 80-12.5. She will continue to check her blood pressure daily and send in her readings in one week. She is going to return for a blood pressure check in 2 weeks. Counseled on DASH diet, decreasing sodium intake, information provided to patient. She has a follow up appointment scheduled with her PCP in April. Strict return precautions given to patient.

## 2022-08-22 ENCOUNTER — Ambulatory Visit (INDEPENDENT_AMBULATORY_CARE_PROVIDER_SITE_OTHER): Payer: BC Managed Care – PPO

## 2022-08-22 VITALS — BP 126/90 | HR 75

## 2022-08-22 DIAGNOSIS — I10 Essential (primary) hypertension: Secondary | ICD-10-CM

## 2022-08-22 NOTE — Progress Notes (Signed)
Pt presented today for a bp check. Pt stated she is taking the Valsartan/HCTZ and last dose was this morning. BP in the left arm was 130/88 pulse 75, right arm was 126/90 pulse 75. Pt was advised that if any changes were necessary that we would give her a call. Pt gave a verbal understanding.

## 2022-09-16 ENCOUNTER — Ambulatory Visit: Payer: BC Managed Care – PPO | Admitting: Family Medicine

## 2022-11-01 ENCOUNTER — Ambulatory Visit: Payer: BC Managed Care – PPO | Admitting: Family Medicine

## 2022-11-01 VITALS — BP 134/84 | HR 73 | Temp 98.3°F | Ht 63.0 in | Wt 186.2 lb

## 2022-11-01 DIAGNOSIS — I10 Essential (primary) hypertension: Secondary | ICD-10-CM

## 2022-11-01 MED ORDER — VALSARTAN-HYDROCHLOROTHIAZIDE 160-12.5 MG PO TABS: 1.0000 | ORAL_TABLET | Freq: Every day | ORAL | 3 refills | Status: DC

## 2022-11-01 NOTE — Assessment & Plan Note (Signed)
Chronic issue.  Improving since starting on valsartan.  Still not at goal.  Will increase the valsartan portion of her medicine to 160 mg daily.  She will continue HCTZ 12.5 mg daily.  New prescription sent to pharmacy.  Patient will return in 10 days for labs and 1 month for follow-up with me or NP.

## 2022-11-01 NOTE — Progress Notes (Signed)
Marikay Alar, MD Phone: [phone]  Barbara Ortega is a 47 y.o. female who presents today for f/u.  HYPERTENSION-patient notes no recent changes that would account for the increase in blood pressure. Disease Monitoring Home BP Monitoring 114-140/90-92 Chest pain- no    Dyspnea- no Medications Compliance-  taking valsartan/HCTZ.   Edema- no Notes she feels quite a bit better compared to her last visit.  BMET    Component Value Date/Time   NA 139 11/07/2021 1015   K 3.8 11/07/2021 1015   CL 103 11/07/2021 1015   CO2 28 11/07/2021 1015   GLUCOSE 99 11/07/2021 1015   BUN 11 11/07/2021 1015   CREATININE 0.83 11/07/2021 1015   CALCIUM 10.1 11/07/2021 1015   The 10-year ASCVD risk score (Arnett DK, et al., 2019) is: 1.8%   Values used to calculate the score:     Age: 70 years     Sex: Female     Is Non-Hispanic African American: No     Diabetic: No     Tobacco smoker: No     Systolic Blood Pressure: 134 mmHg     Is BP treated: Yes     HDL Cholesterol: 38.8 mg/dL     Total Cholesterol: 166 mg/dL   Social History   Tobacco Use  Smoking Status Never  Smokeless Tobacco Never    Current Outpatient Medications on File Prior to Visit  Medication Sig Dispense Refill   azelastine (ASTELIN) 0.1 % nasal spray Place 2 sprays into both nostrils 2 (two) times daily. Use in each nostril as directed 30 mL 12   cetirizine (ZYRTEC) 10 MG tablet Take 10 mg by mouth daily.     gabapentin (NEURONTIN) 300 MG capsule Take 1 capsule (300 mg total) by mouth at bedtime. 90 capsule 3   levonorgestrel (MIRENA) 20 MCG/24HR IUD 1 each by Intrauterine route once.     Multiple Vitamin (MULTIVITAMIN) tablet Take 1 tablet by mouth daily.     No current facility-administered medications on file prior to visit.     ROS see history of present illness  Objective  Physical Exam Vitals:   11/01/22 1420  BP: 134/84  Pulse: 73  Temp: 98.3 F (36.8 C)  SpO2: 99%    BP Readings from Last  3 Encounters:  11/01/22 134/84  08/22/22 (!) 126/90  08/08/22 (!) 146/88   Wt Readings from Last 3 Encounters:  11/01/22 186 lb 3.2 oz (84.5 kg)  08/08/22 184 lb 3.2 oz (83.6 kg)  06/17/22 182 lb 3.2 oz (82.6 kg)    Physical Exam Constitutional:      General: She is not in acute distress.    Appearance: She is not diaphoretic.  Cardiovascular:     Rate and Rhythm: Normal rate and regular rhythm.     Heart sounds: Normal heart sounds.  Pulmonary:     Effort: Pulmonary effort is normal.     Breath sounds: Normal breath sounds.  Skin:    General: Skin is warm and dry.  Neurological:     Mental Status: She is alert.      Assessment/Plan: Please see individual problem list.  Essential hypertension, benign Assessment & Plan: Chronic issue.  Improving since starting on valsartan.  Still not at goal.  Will increase the valsartan portion of her medicine to 160 mg daily.  She will continue HCTZ 12.5 mg daily.  New prescription sent to pharmacy.  Patient will return in 10 days for labs and 1 month for follow-up  with me or NP.  Orders: -     Valsartan-hydroCHLOROthiazide; Take 1 tablet by mouth daily.  Dispense: 90 tablet; Refill: 3 -     Comprehensive metabolic panel; Future -     Lipid panel; Future -     Hemoglobin A1c; Future -     Basic metabolic panel     Return in about 10 days (around 11/11/2022) for labs, 1 month with PCP or Kacy.   Marikay Alar, MD [facility]

## 2022-11-02 LAB — BASIC METABOLIC PANEL
BUN: 14 mg/dL (ref 7–25)
CO2: 23 mmol/L (ref 20–32)
Calcium: 9.8 mg/dL (ref 8.6–10.2)
Chloride: 101 mmol/L (ref 98–110)
Creat: 0.91 mg/dL (ref 0.50–0.99)
Glucose, Bld: 102 mg/dL — ABNORMAL HIGH (ref 65–99)
Potassium: 3.8 mmol/L (ref 3.5–5.3)
Sodium: 139 mmol/L (ref 135–146)

## 2022-11-03 ENCOUNTER — Other Ambulatory Visit: Payer: Self-pay | Admitting: Nurse Practitioner

## 2022-11-03 DIAGNOSIS — I10 Essential (primary) hypertension: Secondary | ICD-10-CM

## 2022-11-04 ENCOUNTER — Ambulatory Visit: Payer: BC Managed Care – PPO | Admitting: Family Medicine

## 2022-11-14 ENCOUNTER — Other Ambulatory Visit (INDEPENDENT_AMBULATORY_CARE_PROVIDER_SITE_OTHER): Payer: BC Managed Care – PPO

## 2022-11-14 DIAGNOSIS — I10 Essential (primary) hypertension: Secondary | ICD-10-CM | POA: Diagnosis not present

## 2022-11-14 LAB — COMPREHENSIVE METABOLIC PANEL
ALT: 30 U/L (ref 0–35)
AST: 22 U/L (ref 0–37)
Albumin: 4.6 g/dL (ref 3.5–5.2)
Alkaline Phosphatase: 55 U/L (ref 39–117)
BUN: 19 mg/dL (ref 6–23)
CO2: 27 mEq/L (ref 19–32)
Calcium: 9.7 mg/dL (ref 8.4–10.5)
Chloride: 104 mEq/L (ref 96–112)
Creatinine, Ser: 0.96 mg/dL (ref 0.40–1.20)
GFR: 70.33 mL/min (ref >60.00)
Glucose, Bld: 89 mg/dL (ref 70–99)
Potassium: 4.1 mEq/L (ref 3.5–5.1)
Sodium: 139 mEq/L (ref 135–145)
Total Bilirubin: 0.6 mg/dL (ref 0.2–1.2)
Total Protein: 7.9 g/dL (ref 6.0–8.3)

## 2022-11-14 LAB — HEMOGLOBIN A1C: Hgb A1c MFr Bld: 5.5 % (ref 4.6–6.5)

## 2022-11-14 LAB — LIPID PANEL
Cholesterol: 166 mg/dL (ref 0–200)
HDL: 35.3 mg/dL — ABNORMAL LOW (ref >39.00)
LDL Cholesterol: 93 mg/dL (ref 0–99)
NonHDL: 130.79
Total CHOL/HDL Ratio: 5
Triglycerides: 189 mg/dL — ABNORMAL HIGH (ref 0.0–149.0)
VLDL: 37.8 mg/dL (ref 0.0–40.0)

## 2022-12-03 ENCOUNTER — Encounter: Payer: Self-pay | Admitting: Nurse Practitioner

## 2022-12-03 ENCOUNTER — Ambulatory Visit: Payer: BC Managed Care – PPO | Admitting: Nurse Practitioner

## 2022-12-03 VITALS — BP 122/82 | HR 85 | Temp 98.7°F | Ht 63.0 in | Wt 187.8 lb

## 2022-12-03 DIAGNOSIS — S46811A Strain of other muscles, fascia and tendons at shoulder and upper arm level, right arm, initial encounter: Secondary | ICD-10-CM | POA: Diagnosis not present

## 2022-12-03 DIAGNOSIS — I10 Essential (primary) hypertension: Secondary | ICD-10-CM

## 2022-12-03 DIAGNOSIS — M79672 Pain in left foot: Secondary | ICD-10-CM | POA: Diagnosis not present

## 2022-12-03 MED ORDER — METHYLPREDNISOLONE 4 MG PO TBPK: ORAL_TABLET | ORAL | 0 refills | Status: DC

## 2022-12-03 MED ORDER — CYCLOBENZAPRINE HCL 5 MG PO TABS: 5.0000 mg | ORAL_TABLET | Freq: Three times a day (TID) | ORAL | 0 refills | Status: DC | PRN

## 2022-12-03 NOTE — Progress Notes (Signed)
[name], NP-C Phone: [phone]  Barbara Ortega is a 47 y.o. female who presents today for follow up.   HYPERTENSION Disease Monitoring Home BP Monitoring- 118-128/80s Chest pain- No    Dyspnea- No Medications Compliance-  Valsartan-Hydrochlorothiazide. Lightheadedness-  Occasionally, first thing in the mornings  Edema- No BMET    Component Value Date/Time   NA 139 11/14/2022 0924   K 4.1 11/14/2022 0924   CL 104 11/14/2022 0924   CO2 27 11/14/2022 0924   GLUCOSE 89 11/14/2022 0924   BUN 19 11/14/2022 0924   CREATININE 0.96 11/14/2022 0924   CREATININE 0.91 11/01/2022 1433   CALCIUM 9.7 11/14/2022 0924   Neck Pain- Patient with right sided neck pain that radiates down into her shoulder x 1 month. States it felt like she had slept on her neck wrong but has not resolved. She has been to the chiropractor and had a massage all without relief of pain. The area is tender to the touch. She has pain with turning her head and sleeping.   Heel Pain- Patient with left heel pain x 1 month that has been gradually worsening. She describes the pain as a pins and needles, pulling sensation on the lateral side of her heel. The pain mostly occurs with flexion of her foot. She denies pain with walking or weight bearing. She does have a Hx of plantar fasciitis and states it does not feel similar to that pain. She does wear orthotic inserts in her shoes. She has tried icing the area and doing home exercises/stretches without relief of symptoms.   Social History   Tobacco Use  Smoking Status Never  Smokeless Tobacco Never    Current Outpatient Medications on File Prior to Visit  Medication Sig Dispense Refill   azelastine (ASTELIN) 0.1 % nasal spray Place 2 sprays into both nostrils 2 (two) times daily. Use in each nostril as directed 30 mL 12   cetirizine (ZYRTEC) 10 MG tablet Take 10 mg by mouth daily.     gabapentin (NEURONTIN) 300 MG capsule Take 1 capsule (300 mg total) by mouth at  bedtime. 90 capsule 3   levonorgestrel (MIRENA) 20 MCG/24HR IUD 1 each by Intrauterine route once.     Multiple Vitamin (MULTIVITAMIN) tablet Take 1 tablet by mouth daily.     valsartan-hydrochlorothiazide (DIOVAN-HCT) 160-12.5 MG tablet Take 1 tablet by mouth daily. 90 tablet 3   No current facility-administered medications on file prior to visit.    ROS see history of present illness  Objective  Physical Exam Vitals:   12/03/22 1305  BP: 122/82  Pulse: 85  Temp: 98.7 F (37.1 C)  SpO2: 96%    BP Readings from Last 3 Encounters:  12/03/22 122/82  11/01/22 134/84  08/22/22 (!) 126/90   Wt Readings from Last 3 Encounters:  12/03/22 187 lb 12.8 oz (85.2 kg)  11/01/22 186 lb 3.2 oz (84.5 kg)  08/08/22 184 lb 3.2 oz (83.6 kg)    Physical Exam Constitutional:      General: She is not in acute distress.    Appearance: Normal appearance.  HENT:     Head: Normocephalic.     Right Ear: Tympanic membrane normal.     Left Ear: Tympanic membrane normal.     Nose: Nose normal.     Mouth/Throat:     Mouth: Mucous membranes are moist.     Pharynx: Oropharynx is clear.  Eyes:     Pupils: Pupils are equal, round, and reactive to light.  Cardiovascular:     Rate and Rhythm: Normal rate and regular rhythm.     Heart sounds: Normal heart sounds.  Pulmonary:     Effort: Pulmonary effort is normal.     Breath sounds: Normal breath sounds.  Musculoskeletal:     Cervical back: Pain with movement and muscular tenderness (right side- trapezius- down neck into shoulder) present. Decreased range of motion.     Left foot: Decreased range of motion (pain with flexion- lateral side of heel).  Skin:    General: Skin is warm and dry.  Neurological:     General: No focal deficit present.     Mental Status: She is alert.  Psychiatric:        Mood and Affect: Mood normal.        Behavior: Behavior normal.    Assessment/Plan: Please see individual problem list.  Trapezius muscle  strain, right, initial encounter Assessment & Plan: Symptoms consistent with muscle strain/spasm. Area very tight and tender to the touch. Will treat with MDP and Flexeril. Encouraged to alternate ice and heat on painful area. She can also use topical pain relief creams or patches to help with pain. Advised gentle massage. She will contact if the area does not improve.   Orders: -     methylPREDNISolone; Take as directed.  Dispense: 21 each; Refill: 0 -     Cyclobenzaprine HCl; Take 1 tablet (5 mg total) by mouth 3 (three) times daily as needed for muscle spasms.  Dispense: 30 tablet; Refill: 0  Pain of left heel Assessment & Plan: Will refer to Podiatry for further evaluation.   Orders: -     Ambulatory referral to Podiatry  Essential hypertension, benign Assessment & Plan: Chronic. At goal today in office. Continue Valsartan-Hydrochlorothiazide 160-12.5 mg daily. She will continue to monitor her blood pressure at home. She will contact if she has worsening or more frequent episodes of lightheadedness.     Return in about 3 months (around 03/05/2023) for Follow up with PCP or myself.   [name], NP-C [facility] [employer] [facility]

## 2022-12-03 NOTE — Assessment & Plan Note (Addendum)
Chronic. At goal today in office. Continue Valsartan-Hydrochlorothiazide 160-12.5 mg daily. She will continue to monitor her blood pressure at home. She will contact if she has worsening or more frequent episodes of lightheadedness.

## 2022-12-06 NOTE — Assessment & Plan Note (Signed)
Symptoms consistent with muscle strain/spasm. Area very tight and tender to the touch. Will treat with MDP and Flexeril. Encouraged to alternate ice and heat on painful area. She can also use topical pain relief creams or patches to help with pain. Advised gentle massage. She will contact if the area does not improve.

## 2022-12-06 NOTE — Assessment & Plan Note (Signed)
Will refer to Podiatry for further evaluation.  

## 2022-12-13 ENCOUNTER — Other Ambulatory Visit: Payer: Self-pay | Admitting: Family Medicine

## 2022-12-13 DIAGNOSIS — Z1231 Encounter for screening mammogram for malignant neoplasm of breast: Secondary | ICD-10-CM

## 2022-12-26 ENCOUNTER — Ambulatory Visit: Payer: BC Managed Care – PPO | Admitting: Podiatry

## 2022-12-26 DIAGNOSIS — M722 Plantar fascial fibromatosis: Secondary | ICD-10-CM

## 2022-12-26 NOTE — Progress Notes (Signed)
Subjective:  Patient ID: Barbara Ortega, female    DOB: 1975-10-29,  MRN: [id number]  Chief Complaint  Patient presents with   Foot Pain    Left heel pain since beginning of June. She stated she doesn't have a lot of pain when walking its more when she bends over or flexes her foot she stated it feels like pin and needles and like something is pulling on the left side of her heel     47 y.o. female presents with the above complaint.  Patient presents with left heel pain lateral band.  Patient states is painful to touch is progressive gotten worse worse with ambulation worse with pressure has been going on since June as been hurting more as she bends forward and flexes her foot.  Feels like pins-and-needles.  She wanted discuss treatment options for this.  She has not seen MRIs prior to seeing me for this.  Denies any other acute complaints.   Review of Systems: Negative except as noted in the HPI. Denies N/V/F/Ch.  Past Medical History:  Diagnosis Date   Alpha-1-antitrypsin deficiency (HCC)    Basal cell carcinoma 04/29/2008   Left medial infraocular   Dysplastic nevus 01/24/2009   Left 3rd distal toe, periungual. Moderate atypia, close to margin   Hypertension    Migraine    Sinus disease     Current Outpatient Medications:    azelastine (ASTELIN) 0.1 % nasal spray, Place 2 sprays into both nostrils 2 (two) times daily. Use in each nostril as directed, Disp: 30 mL, Rfl: 12   cetirizine (ZYRTEC) 10 MG tablet, Take 10 mg by mouth daily., Disp: , Rfl:    cyclobenzaprine (FLEXERIL) 5 MG tablet, Take 1 tablet (5 mg total) by mouth 3 (three) times daily as needed for muscle spasms., Disp: 30 tablet, Rfl: 0   gabapentin (NEURONTIN) 300 MG capsule, Take 1 capsule (300 mg total) by mouth at bedtime., Disp: 90 capsule, Rfl: 3   levonorgestrel (MIRENA) 20 MCG/24HR IUD, 1 each by Intrauterine route once., Disp: , Rfl:    methylPREDNISolone (MEDROL DOSEPAK) 4 MG TBPK tablet, Take as  directed., Disp: 21 each, Rfl: 0   Multiple Vitamin (MULTIVITAMIN) tablet, Take 1 tablet by mouth daily., Disp: , Rfl:    valsartan-hydrochlorothiazide (DIOVAN-HCT) 160-12.5 MG tablet, Take 1 tablet by mouth daily., Disp: 90 tablet, Rfl: 3  Social History   Tobacco Use  Smoking Status Never  Smokeless Tobacco Never    Allergies  Allergen Reactions   Elemental Sulfur Hives   Objective:  There were no vitals filed for this visit. There is no height or weight on file to calculate BMI. Constitutional Well developed. Well nourished.  Vascular Dorsalis pedis pulses palpable bilaterally. Posterior tibial pulses palpable bilaterally. Capillary refill normal to all digits.  No cyanosis or clubbing noted. Pedal hair growth normal.  Neurologic Normal speech. Oriented to person, place, and time. Epicritic sensation to light touch grossly present bilaterally.  Dermatologic Nails well groomed and normal in appearance. No open wounds. No skin lesions.  Orthopedic: Normal joint ROM without pain or crepitus bilaterally. No visible deformities. Tender to palpation at the calcaneal tuber left. No pain with calcaneal squeeze left. Ankle ROM diminished range of motion left. Silfverskiold Test: positive left.   Radiographs: None  Assessment:   1. Plantar fasciitis of left foot    Plan:  Patient was evaluated and treated and all questions answered.  Plantar Fasciitis, left lateral bend - XR reviewed as above.  -  Educated on icing and stretching. Instructions given.  - Injection delivered to the plantar fascia as below. - DME: Plantar fascial brace dispensed to support the medial longitudinal arch of the foot and offload pressure from the heel and prevent arch collapse during weightbearing - Pharmacologic management: None  Procedure: Injection Tendon/Ligament Location: Left plantar fascia at the glabrous junction; medial approach. Skin Prep: alcohol Injectate: 0.5 cc 0.5% marcaine  plain, 0.5 cc of 1% Lidocaine, 0.5 cc kenalog 10. Disposition: Patient tolerated procedure well. Injection site dressed with a band-aid.  No follow-ups on file.

## 2023-01-09 DIAGNOSIS — Z1231 Encounter for screening mammogram for malignant neoplasm of breast: Secondary | ICD-10-CM

## 2023-01-23 ENCOUNTER — Ambulatory Visit: Payer: BC Managed Care – PPO | Admitting: Podiatry

## 2023-01-23 DIAGNOSIS — M722 Plantar fascial fibromatosis: Secondary | ICD-10-CM

## 2023-01-23 MED ORDER — MELOXICAM 15 MG PO TABS: 15.0000 mg | ORAL_TABLET | Freq: Every day | ORAL | 0 refills | Status: DC

## 2023-01-23 MED ORDER — METHYLPREDNISOLONE 4 MG PO TBPK: ORAL_TABLET | ORAL | 0 refills | Status: DC

## 2023-01-23 NOTE — Progress Notes (Signed)
Subjective:  Patient ID: Barbara Ortega, female    DOB: Mar 14, 1976,  MRN: [id number]  Chief Complaint  Patient presents with   Plantar Fasciitis    Pt stated that it is about the same but the brace does help     47 y.o. female presents with the above complaint.  Patient presents for follow-up of left plantar fasciitis.  She states that it still painful has not helped much.  The brace does help a little bit.  She would like to discuss next treatment plan.   Review of Systems: Negative except as noted in the HPI. Denies N/V/F/Ch.  Past Medical History:  Diagnosis Date   Alpha-1-antitrypsin deficiency (HCC)    Basal cell carcinoma 04/29/2008   Left medial infraocular   Dysplastic nevus 01/24/2009   Left 3rd distal toe, periungual. Moderate atypia, close to margin   Hypertension    Migraine    Sinus disease     Current Outpatient Medications:    azelastine (ASTELIN) 0.1 % nasal spray, Place 2 sprays into both nostrils 2 (two) times daily. Use in each nostril as directed, Disp: 30 mL, Rfl: 12   cetirizine (ZYRTEC) 10 MG tablet, Take 10 mg by mouth daily., Disp: , Rfl:    cyclobenzaprine (FLEXERIL) 5 MG tablet, Take 1 tablet (5 mg total) by mouth 3 (three) times daily as needed for muscle spasms., Disp: 30 tablet, Rfl: 0   gabapentin (NEURONTIN) 300 MG capsule, Take 1 capsule (300 mg total) by mouth at bedtime., Disp: 90 capsule, Rfl: 3   levonorgestrel (MIRENA) 20 MCG/24HR IUD, 1 each by Intrauterine route once., Disp: , Rfl:    meloxicam (MOBIC) 15 MG tablet, Take 1 tablet (15 mg total) by mouth daily., Disp: 30 tablet, Rfl: 0   methylPREDNISolone (MEDROL DOSEPAK) 4 MG TBPK tablet, Take as directed., Disp: 21 each, Rfl: 0   methylPREDNISolone (MEDROL DOSEPAK) 4 MG TBPK tablet, Take as directed, Disp: 21 each, Rfl: 0   Multiple Vitamin (MULTIVITAMIN) tablet, Take 1 tablet by mouth daily., Disp: , Rfl:    valsartan-hydrochlorothiazide (DIOVAN-HCT) 160-12.5 MG tablet, Take 1 tablet  by mouth daily., Disp: 90 tablet, Rfl: 3  Social History   Tobacco Use  Smoking Status Never  Smokeless Tobacco Never    Allergies  Allergen Reactions   Elemental Sulfur Hives   Objective:  There were no vitals filed for this visit. There is no height or weight on file to calculate BMI. Constitutional Well developed. Well nourished.  Vascular Dorsalis pedis pulses palpable bilaterally. Posterior tibial pulses palpable bilaterally. Capillary refill normal to all digits.  No cyanosis or clubbing noted. Pedal hair growth normal.  Neurologic Normal speech. Oriented to person, place, and time. Epicritic sensation to light touch grossly present bilaterally.  Dermatologic Nails well groomed and normal in appearance. No open wounds. No skin lesions.  Orthopedic: Normal joint ROM without pain or crepitus bilaterally. No visible deformities. Tender to palpation at the calcaneal tuber left. No pain with calcaneal squeeze left. Ankle ROM diminished range of motion left. Silfverskiold Test: positive left.   Radiographs: None  Assessment:   1. Plantar fasciitis of left foot     Plan:  Patient was evaluated and treated and all questions answered.  Plantar Fasciitis, left lateral bend - XR reviewed as above.  - Educated on icing and stretching. Instructions given.  -No further injection as they have not helped  - DME: Cam boot - Pharmacologic management: Medrol Dosepak and meloxicam -  .  No  follow-ups on file.

## 2023-01-28 ENCOUNTER — Other Ambulatory Visit: Payer: Self-pay | Admitting: Family Medicine

## 2023-01-28 DIAGNOSIS — Z1231 Encounter for screening mammogram for malignant neoplasm of breast: Secondary | ICD-10-CM

## 2023-02-19 ENCOUNTER — Ambulatory Visit
Admission: RE | Admit: 2023-02-19 | Discharge: 2023-02-19 | Disposition: A | Discharge status: Home / Self Care | Payer: BC Managed Care – PPO | Source: Ambulatory Visit

## 2023-02-19 ENCOUNTER — Other Ambulatory Visit: Payer: Self-pay | Admitting: Podiatry

## 2023-02-19 DIAGNOSIS — Z1231 Encounter for screening mammogram for malignant neoplasm of breast: Secondary | ICD-10-CM

## 2023-02-20 ENCOUNTER — Ambulatory Visit: Payer: BC Managed Care – PPO | Admitting: Podiatry

## 2023-02-20 DIAGNOSIS — M722 Plantar fascial fibromatosis: Secondary | ICD-10-CM

## 2023-02-20 NOTE — Progress Notes (Signed)
Subjective:  Patient ID: Barbara Ortega, female    DOB: December 08, 1975,  MRN: [id number]  Chief Complaint  Patient presents with   Plantar Fasciitis    47 y.o. female presents with the above complaint.  Patient presents for follow-up of left plantar fasciitis.  She states that it still painful has not helped much.  Cam boot immobilization did not help.  She would like to discuss physical therapy   Review of Systems: Negative except as noted in the HPI. Denies N/V/F/Ch.  Past Medical History:  Diagnosis Date   Alpha-1-antitrypsin deficiency (HCC)    Basal cell carcinoma 04/29/2008   Left medial infraocular   Dysplastic nevus 01/24/2009   Left 3rd distal toe, periungual. Moderate atypia, close to margin   Hypertension    Migraine    Sinus disease     Current Outpatient Medications:    azelastine (ASTELIN) 0.1 % nasal spray, Place 2 sprays into both nostrils 2 (two) times daily. Use in each nostril as directed, Disp: 30 mL, Rfl: 12   cetirizine (ZYRTEC) 10 MG tablet, Take 10 mg by mouth daily., Disp: , Rfl:    cyclobenzaprine (FLEXERIL) 5 MG tablet, Take 1 tablet (5 mg total) by mouth 3 (three) times daily as needed for muscle spasms., Disp: 30 tablet, Rfl: 0   gabapentin (NEURONTIN) 300 MG capsule, Take 1 capsule (300 mg total) by mouth at bedtime., Disp: 90 capsule, Rfl: 3   levonorgestrel (MIRENA) 20 MCG/24HR IUD, 1 each by Intrauterine route once., Disp: , Rfl:    meloxicam (MOBIC) 15 MG tablet, TAKE 1 TABLET(15 MG) BY MOUTH DAILY, Disp: 30 tablet, Rfl: 0   methylPREDNISolone (MEDROL DOSEPAK) 4 MG TBPK tablet, Take as directed., Disp: 21 each, Rfl: 0   methylPREDNISolone (MEDROL DOSEPAK) 4 MG TBPK tablet, Take as directed, Disp: 21 each, Rfl: 0   Multiple Vitamin (MULTIVITAMIN) tablet, Take 1 tablet by mouth daily., Disp: , Rfl:    valsartan-hydrochlorothiazide (DIOVAN-HCT) 160-12.5 MG tablet, Take 1 tablet by mouth daily., Disp: 90 tablet, Rfl: 3  Social History   Tobacco Use   Smoking Status Never  Smokeless Tobacco Never    Allergies  Allergen Reactions   Elemental Sulfur Hives   Objective:  There were no vitals filed for this visit. There is no height or weight on file to calculate BMI. Constitutional Well developed. Well nourished.  Vascular Dorsalis pedis pulses palpable bilaterally. Posterior tibial pulses palpable bilaterally. Capillary refill normal to all digits.  No cyanosis or clubbing noted. Pedal hair growth normal.  Neurologic Normal speech. Oriented to person, place, and time. Epicritic sensation to light touch grossly present bilaterally.  Dermatologic Nails well groomed and normal in appearance. No open wounds. No skin lesions.  Orthopedic: Normal joint ROM without pain or crepitus bilaterally. No visible deformities. Tender to palpation at the calcaneal tuber left. No pain with calcaneal squeeze left. Ankle ROM diminished range of motion left. Silfverskiold Test: positive left.   Radiographs: None  Assessment:   1. Plantar fasciitis of left foot      Plan:  Patient was evaluated and treated and all questions answered.  Plantar Fasciitis, left lateral bend - XR reviewed as above.  - Educated on icing and stretching. Instructions given.  -No further injection as they have not helped  - DME: Discontinue cam boot as has not helped. - Pharmacologic management: Medrol Dosepak and meloxicam -She will benefit from physical therapy for the lateral band of the plantar fasciitis.  Physical therapy prescription was given -  If no improvement we will discuss MRI  .  No follow-ups on file.

## 2023-02-24 ENCOUNTER — Encounter: Payer: Self-pay | Admitting: Podiatry

## 2023-02-24 NOTE — Progress Notes (Signed)
Orthotic order placed Items to be fit when in  [employer], CFo, CFm

## 2023-03-10 ENCOUNTER — Ambulatory Visit: Payer: BC Managed Care – PPO | Admitting: Family Medicine

## 2023-03-14 ENCOUNTER — Encounter: Payer: Self-pay | Admitting: Family Medicine

## 2023-03-14 ENCOUNTER — Ambulatory Visit: Payer: BC Managed Care – PPO | Admitting: Family Medicine

## 2023-03-14 VITALS — BP 126/78 | HR 73 | Temp 98.0°F | Ht 63.0 in | Wt 187.4 lb

## 2023-03-14 DIAGNOSIS — M79672 Pain in left foot: Secondary | ICD-10-CM | POA: Diagnosis not present

## 2023-03-14 DIAGNOSIS — I10 Essential (primary) hypertension: Secondary | ICD-10-CM | POA: Diagnosis not present

## 2023-03-14 NOTE — Progress Notes (Signed)
Marikay Alar, MD Phone: [phone]  Barbara Ortega is a 47 y.o. female who presents today for follow-up.  Hypertension: Typically 120s-130s over 70s-mid 80s.  Symptomatically feels better since her blood pressure has come down.  No chest pain, shortness of breath, or edema.  She is taking valsartan/HCTZ.  Plantar fasciitis: This has been going on since June.  Notes this has limited her exercise though she is still walking some.  She is wearing [employer] shoes.  She has seen podiatry and has gotten an injection and was placed on prednisone and has worn a boot.  She notes those things have helped some.  She is going to start PT soon.  Social History   Tobacco Use  Smoking Status Never  Smokeless Tobacco Never    Current Outpatient Medications on File Prior to Visit  Medication Sig Dispense Refill   azelastine (ASTELIN) 0.1 % nasal spray Place 2 sprays into both nostrils 2 (two) times daily. Use in each nostril as directed 30 mL 12   cetirizine (ZYRTEC) 10 MG tablet Take 10 mg by mouth daily.     cyclobenzaprine (FLEXERIL) 5 MG tablet Take 1 tablet (5 mg total) by mouth 3 (three) times daily as needed for muscle spasms. 30 tablet 0   gabapentin (NEURONTIN) 300 MG capsule Take 1 capsule (300 mg total) by mouth at bedtime. 90 capsule 3   levonorgestrel (MIRENA) 20 MCG/24HR IUD 1 each by Intrauterine route once.     meloxicam (MOBIC) 15 MG tablet TAKE 1 TABLET(15 MG) BY MOUTH DAILY 30 tablet 0   methylPREDNISolone (MEDROL DOSEPAK) 4 MG TBPK tablet Take as directed. 21 each 0   methylPREDNISolone (MEDROL DOSEPAK) 4 MG TBPK tablet Take as directed 21 each 0   Multiple Vitamin (MULTIVITAMIN) tablet Take 1 tablet by mouth daily.     valsartan-hydrochlorothiazide (DIOVAN-HCT) 160-12.5 MG tablet Take 1 tablet by mouth daily. 90 tablet 3   No current facility-administered medications on file prior to visit.     ROS see history of present illness  Objective  Physical Exam Vitals:    03/14/23 1006  BP: 126/78  Pulse: 73  Temp: 98 F (36.7 C)  SpO2: 98%    BP Readings from Last 3 Encounters:  03/14/23 126/78  12/03/22 122/82  11/01/22 134/84   Wt Readings from Last 3 Encounters:  03/14/23 187 lb 6.4 oz (85 kg)  12/03/22 187 lb 12.8 oz (85.2 kg)  11/01/22 186 lb 3.2 oz (84.5 kg)    Physical Exam Constitutional:      General: She is not in acute distress.    Appearance: She is not diaphoretic.  Cardiovascular:     Rate and Rhythm: Normal rate and regular rhythm.     Heart sounds: Normal heart sounds.  Pulmonary:     Effort: Pulmonary effort is normal.     Breath sounds: Normal breath sounds.  Skin:    General: Skin is warm and dry.  Neurological:     Mental Status: She is alert.      Assessment/Plan: Please see individual problem list.  Essential hypertension, benign Assessment & Plan: Chronic issue.  At goal today.  She will continue valsartan-HCTZ 160-12.5 mg daily.  Lab work to be checked at next visit.   Pain of left heel Assessment & Plan: Chronic issue.  Patient diagnosed with plantar fasciitis.  She will proceed with physical therapy as planned.  Discussed it could take up to a year for the plantar fasciitis to resolve on its  own.  She will continue to see podiatry.     Return in about 6 months (around 09/12/2023) for transfer of care with [facility].   Marikay Alar, MD [facility]

## 2023-03-14 NOTE — Assessment & Plan Note (Signed)
Chronic issue.  Patient diagnosed with plantar fasciitis.  She will proceed with physical therapy as planned.  Discussed it could take up to a year for the plantar fasciitis to resolve on its own.  She will continue to see podiatry.

## 2023-03-14 NOTE — Assessment & Plan Note (Signed)
Chronic issue.  At goal today.  She will continue valsartan-HCTZ 160-12.5 mg daily.  Lab work to be checked at next visit.

## 2023-03-22 ENCOUNTER — Other Ambulatory Visit: Payer: Self-pay | Admitting: Podiatry

## 2023-04-21 ENCOUNTER — Ambulatory Visit: Payer: BC Managed Care – PPO | Admitting: Nurse Practitioner

## 2023-04-21 ENCOUNTER — Encounter: Payer: Self-pay | Admitting: Nurse Practitioner

## 2023-04-21 ENCOUNTER — Ambulatory Visit: Payer: BC Managed Care – PPO

## 2023-04-21 VITALS — BP 126/80 | HR 79 | Temp 98.3°F | Ht 63.0 in | Wt 186.8 lb

## 2023-04-21 DIAGNOSIS — M542 Cervicalgia: Secondary | ICD-10-CM | POA: Insufficient documentation

## 2023-04-21 DIAGNOSIS — M62838 Other muscle spasm: Secondary | ICD-10-CM | POA: Insufficient documentation

## 2023-04-21 NOTE — Assessment & Plan Note (Addendum)
They exhibit persistent right-sided neck pain, unrelieved by steroids, muscle relaxers, and meloxicam. The pain, constant and worsening when turning to the right, radiates to the jaw, ear, and down the back of the shoulder blade, without any numbness or tingling in the arms or fingers. Consistent with muscle strain/spasm. Tenderness present on exam. We will order a cervical x-ray to rule out disc issues and refer them to physical therapy. They should continue using Icy Hot, Asper cream, and Salonpas patches for temporary relief and Flexeril as needed for sleep.

## 2023-04-21 NOTE — Progress Notes (Signed)
[name], NP-C Phone: [phone]  Tiasia Tyree Kubin is a 47 y.o. female who presents today for neck pain.   Discussed the use of AI scribe software for clinical note transcription with the patient, who gave verbal consent to proceed.  History of Present Illness   The patient, with a history of hypertension, presents with persistent right-sided neck pain that has been ongoing since July. Despite attempts at relief through chiropractic care, massage, and medication (including steroids, muscle relaxers, and meloxicam), the patient reports no improvement. The pain is described as a constant tightness, extending from the neck down into the shoulder. Recently, the pain has worsened, radiating through the jaw, ear, and down the back of the shoulder blade. The patient experiences increased discomfort when turning to the right and finds relief in stretching the neck.  Sleeping patterns have been adjusted, with the patient discontinuing the use of a high pillow, but this has not alleviated the pain. The patient has been using over-the-counter topical analgesics such as [facility] and Aspercreme, as well as heat and ice packs, but these only provide temporary relief.  The patient also reports a decrease in physical strength and dexterity, particularly in the hands, but it is unclear if this is related to the neck pain. There is no reported numbness or tingling in the arms or fingers. The patient also mentions ongoing foot issues, but these have improved.      Social History   Tobacco Use  Smoking Status Never  Smokeless Tobacco Never    Current Outpatient Medications on File Prior to Visit  Medication Sig Dispense Refill   azelastine (ASTELIN) 0.1 % nasal spray Place 2 sprays into both nostrils 2 (two) times daily. Use in each nostril as directed 30 mL 12   cetirizine (ZYRTEC) 10 MG tablet Take 10 mg by mouth daily.     cyclobenzaprine (FLEXERIL) 5 MG tablet Take 1 tablet (5 mg total) by mouth 3  (three) times daily as needed for muscle spasms. 30 tablet 0   gabapentin (NEURONTIN) 300 MG capsule Take 1 capsule (300 mg total) by mouth at bedtime. 90 capsule 3   levonorgestrel (MIRENA) 20 MCG/24HR IUD 1 each by Intrauterine route once.     meloxicam (MOBIC) 15 MG tablet TAKE 1 TABLET(15 MG) BY MOUTH DAILY 30 tablet 0   methylPREDNISolone (MEDROL DOSEPAK) 4 MG TBPK tablet Take as directed. 21 each 0   methylPREDNISolone (MEDROL DOSEPAK) 4 MG TBPK tablet Take as directed 21 each 0   Multiple Vitamin (MULTIVITAMIN) tablet Take 1 tablet by mouth daily.     valsartan-hydrochlorothiazide (DIOVAN-HCT) 160-12.5 MG tablet Take 1 tablet by mouth daily. 90 tablet 3   No current facility-administered medications on file prior to visit.     ROS see history of present illness  Objective  Physical Exam Vitals:   04/21/23 1501  BP: 126/80  Pulse: 79  Temp: 98.3 F (36.8 C)  SpO2: 98%    BP Readings from Last 3 Encounters:  04/21/23 126/80  03/14/23 126/78  12/03/22 122/82   Wt Readings from Last 3 Encounters:  04/21/23 186 lb 12.8 oz (84.7 kg)  03/14/23 187 lb 6.4 oz (85 kg)  12/03/22 187 lb 12.8 oz (85.2 kg)    Physical Exam Constitutional:      General: She is not in acute distress.    Appearance: Normal appearance.  HENT:     Head: Normocephalic.  Cardiovascular:     Rate and Rhythm: Normal rate and regular rhythm.  Heart sounds: Normal heart sounds.  Pulmonary:     Effort: Pulmonary effort is normal.     Breath sounds: Normal breath sounds.  Musculoskeletal:     Cervical back: Pain with movement (turning to the right) and muscular tenderness present. Decreased range of motion (limited to the right due to pain).  Skin:    General: Skin is warm and dry.  Neurological:     General: No focal deficit present.     Mental Status: She is alert.  Psychiatric:        Mood and Affect: Mood normal.        Behavior: Behavior normal.    Assessment/Plan: Please see  individual problem list.  Neck pain Assessment & Plan: They exhibit persistent right-sided neck pain, unrelieved by steroids, muscle relaxers, and meloxicam. The pain, constant and worsening when turning to the right, radiates to the jaw, ear, and down the back of the shoulder blade, without any numbness or tingling in the arms or fingers. Consistent with muscle strain/spasm. Tenderness present on exam. We will order a cervical x-ray to rule out disc issues and refer them to physical therapy. They should continue using Icy Hot, Asper cream, and Salonpas patches for temporary relief and Flexeril as needed for sleep.   Orders: -     DG Cervical Spine Complete; Future -     Ambulatory referral to Physical Therapy    Return if symptoms worsen or fail to improve.   [name], NP-C [facility] [employer] [facility]

## 2023-06-03 NOTE — Therapy (Signed)
 OUTPATIENT PHYSICAL THERAPY NECK EVALUATION   Patient Name: Barbara Ortega MRN: [id number] DOB:04-20-1976, 48 y.o., female Today's Date: 06/04/2023  END OF SESSION:  PT End of Session - 06/06/23 1338     Visit Number 1    Number of Visits 13    Date for PT Re-Evaluation 07/16/23    PT Start Time 1547    PT Stop Time 1628    PT Time Calculation (min) 41 min    Behavior During Therapy [facility] for tasks assessed/performed             Past Medical History:  Diagnosis Date   Alpha-1-antitrypsin deficiency (HCC)    Basal cell carcinoma 04/29/2008   Left medial infraocular   Dysplastic nevus 01/24/2009   Left 3rd distal toe, periungual. Moderate atypia, close to margin   Hypertension    Migraine    Sinus disease    Past Surgical History:  Procedure Laterality Date   BREAST BIOPSY Right 2019   Benign   BREAST BIOPSY Left 2012   benign   COLONOSCOPY WITH PROPOFOL  N/A 01/02/2021   Procedure: COLONOSCOPY WITH PROPOFOL ;  Surgeon: Janalyn Keene NOVAK, MD;  Location: [facility];  Service: Endoscopy;  Laterality: N/A;   NASAL SINUS SURGERY  2011   Dr. 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   Left knee pain 05/09/2021   Tension headache 10/26/2018   Cough 10/26/2018   Hypertriglyceridemia 10/26/2018   Hypertensive disorder 07/15/2018   Contraceptive management 09/21/2014   Alpha-1-antitrypsin deficiency (HCC) 08/22/2014   Migraine with aura 08/22/2014   Fatty infiltration of liver 08/11/2013   Essential hypertension, benign 09/07/2012    Allergic rhinitis 08/05/2012    PCP: Maribeth Camellia MATSU, MD  REFERRING PROVIDER: Gretel App, NP  REFERRING DIAG:  M54.2 (ICD-10-CM) - Neck pain    RATIONALE FOR EVALUATION AND TREATMENT: Rehabilitation  THERAPY DIAG: Cervicalgia  Right shoulder pain, unspecified chronicity  ONSET DATE: April 2024  FOLLOW-UP APPT SCHEDULED WITH REFERRING PROVIDER: Yes ; f/u with App Gretel in April    SUBJECTIVE:  Chief Complaint: Pt is a 48 year old female referred for neck/upper trap pain with referring diagnosis of trapezius strain (R side).   Pertinent History Pt is a 48 year old female referred for neck/upper trap pain with referring diagnosis of trapezius strain. Atraumatic onset for neck pain last Spring. Pt reports it felt like crick in the neck as if she slept wrong. Pt reports starting chiropractic with no relief after 2 months. She reports she stopped this; she then went to PCP and was prescribed prednisone  and Meloxicam  at the time without relief. Pt had recent X-ray following referral from PCP with evidence for cervical spine degenerative changes. Pt is kindergarten and tourist information centre manager. Patient reports difficulty with looking over her R shoulder in particular. Pt reports some intermittent difficulty with swallowing pills/medication. Patient reports no dysarthria or diplopia. Patient reports no recent vertigo or N&V recently;she did have one episode of dizziness after chiropractic adjustment - this made her no longer want to pursue chiropractic treatment.  Pain:  Pain Intensity: Present: 5-6/10, Best: 5/10, Worst: 7-8/10 Pain location: tight along R upper trap at rest; pt reports radiating to upper arm and also up to R side of jaw; R paracervical region  Pain Quality:  tight   Radiating: Yes ;R upper  arm, R side of jaw Numbness/Tingling: Yes; down R upper limb, upper arm mainly  Focal Weakness: Yes; pt feels dexterity with hands has worsened; difficulty opening jars  Aggravating factors: Attempting to look over shoulder, lateral flexion to R mainly  Relieving factors: Transient relief with Tylenol  arthritis,  24-hour pain behavior: more stiff in AM; pain doesn't change much through day  History of prior neck injury, pain, surgery, or therapy: Yes; hx of chiropractic, Hx of prednisone  taper and Meloxicam  Dominant hand: right Imaging: Yes ;  CLINICAL DATA:  pain   EXAM: CERVICAL SPINE - COMPLETE 5 VIEW   COMPARISON:  None Available.   FINDINGS: No fracture, dislocation or subluxation. No spondylolisthesis. No osteolytic or osteoblastic changes. Prevertebral and cervical cranial soft tissues are unremarkable.   Degenerative disc disease noted with disc space narrowing and marginal osteophytes at C4-C6.   IMPRESSION: Degenerative changes. No acute osseous abnormalities.    Red flags (personal history of cancer, h/o spinal tumors, history of compression fracture, chills/fever, night sweats, nausea, vomiting, unrelenting pain): Night sweats due to menopause   PRECAUTIONS: None  WEIGHT BEARING RESTRICTIONS: No  FALLS: Has patient fallen in last 6 months? No  Living Environment Lives with: lives with their spouse and lives with their son Lives in: House/apartment  Prior level of function: Independent  Occupational demands: Midwife; Dance (jazz, administrator, sports)  Hobbies: Walking regimen with her husband   Patient Goals: Improved pain, neck/shoulder feeling better   OBJECTIVE:   Patient Surveys  FOTO 59/predicted outcome score of 69   Cognition Patient is oriented to person, place, and time.  Recent memory is intact.  Remote memory is intact.  Attention span and concentration are intact.  Expressive speech is intact.  Patient's fund of knowledge is within  normal limits for educational level.    Gross Musculoskeletal Assessment Tremor: None Bulk: Normal Tone: Normal   Gait Unremarkable  Posture Inc thoracic kyphosis, pt rests in moderate cervical protraction, rounded shoulders     AROM AROM (Normal range in degrees) AROM 06/04/23  Cervical  Flexion (50) 40  Extension (80) 47  Right lateral flexion (45) 38*  Left lateral flexion (45) 32 (tight)  Right rotation (85) 55*  Left rotation (85) 71  (* =  pain; Blank rows = not tested)   Shoulder AROM WFL for all planes of motion, no pain reproduced or exacerbated   MMT MMT (out of 5) Right 06/04/23 Left 06/04/23      Shoulder   Flexion 4+ 4+  Extension    Abduction 4+* 4+  Internal rotation 5 5  External rotation  4* 4  Horizontal abduction    Horizontal adduction    Lower Trapezius    Rhomboids        Elbow  Flexion 5 5  Extension 5 5  Pronation    Supination        Wrist  Flexion 5 5  Extension 5 5  Radial deviation    Ulnar deviation        (* = pain; Blank rows = not tested)  Sensation Grossly intact to light touch bilateral UE as determined by testing dermatomes C2-T2. Proprioception and hot/cold testing deferred on this date.  Reflexes R/L Elbow: 1+/1+  Brachioradialis: 1+/1+  Tricep: 1+/1+  Palpation Location LEFT  RIGHT           Suboccipitals 0 0  Cervical paraspinals 0 0  Upper Trapezius 0 1  Levator Scapulae 0 1  Rhomboid Major/Minor 0 1  (Blank rows = not tested) Graded on 0-4 scale (0 = no pain, 1 = pain, 2 = pain with wincing/grimacing/flinching, 3 = pain with withdrawal, 4 = unwilling to allow palpation), (Blank rows = not tested)  Repeated Movements Repeated cervical retraction: no effect during, no effect after   Passive Accessory Intervertebral Motion Deferred to visit # 2    SPECIAL TESTS Spurlings A (ipsilateral lateral flexion/axial compression): R: Positive L: Negative Distraction Test: Negative  Hoffman Sign (cervical  cord compression): R: Negative L: Negative ULTT Median: R: Not examined L: Not examined ULTT Ulnar: R: Not examined L: Not examined ULTT Radial: R: Not examined L: Not examined     TODAY'S TREATMENT     Therapeutic Exercise - for HEP establishment, discussion on appropriate exercise/activity modification, PT education   Reviewed baseline home exercises and provided handout for [employer] program (see Access Code); tactile cueing and therapist demonstration utilized as needed for carryover of proper technique to HEP.    Patient education on current condition, anatomy involved, prognosis, plan of care. Discussion on activity modification to prevent flare-up of condition, including postural correction at work and refraining from sustained protraction/flexion positions of cervical spine.     PATIENT EDUCATION:  Education details: see above for patient education details Person educated: Patient Education method: Explanation, Demonstration, and Handouts Education comprehension: verbalized understanding and returned demonstration   HOME EXERCISE PROGRAM:  Access Code: [facility] URL: [email or web address] Date: 06/04/2023 Prepared by: Venetia Endo  Exercises - Seated Cervical Retraction  - 3 x daily - 7 x weekly - 2 sets - 10 reps - 1sec hold - Seated Upper Trapezius Stretch  - 2 x daily - 7 x weekly - 3 sets - 30sec hold - Seated Levator Scapulae Stretch  - 2 x daily - 7 x weekly - 3 sets - 30sec hold    ASSESSMENT:  CLINICAL IMPRESSION: Patient is a 48 y.o. female who was seen today for physical therapy evaluation and treatment for neck/R upper trap pain. R paracervical and periscapular pain began with insidious onset and no trauma. Pt has no significant red flags (night sweats explained by perimenopausal symptoms). Patient has positive R Spurling's test and clinical history consistent with nerve root involvement of C-spine and myofascial pain  affecting R  paracervical/periscapular region. No myotome/dermatome deficits consistent with classic radiculopathy. 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 Pt will continue to benefit from skilled PT services to address deficits and improve function.   OBJECTIVE IMPAIRMENTS: decreased ROM, hypomobility, impaired flexibility, impaired UE functional use, and postural dysfunction.   ACTIVITY LIMITATIONS: lifting, sitting, standing, transfers, and scanning environment  PARTICIPATION LIMITATIONS: driving, community activity, and occupation  PERSONAL FACTORS: Past/current experiences, Time since onset of injury/illness/exacerbation, and 2 comorbidities (HTN and migraine history) are also affecting patient's functional outcome.   REHAB POTENTIAL: Good  CLINICAL DECISION MAKING: Evolving/moderate complexity  EVALUATION COMPLEXITY: Moderate   GOALS: Goals reviewed with patient? Yes  SHORT TERM GOALS: Target date: 06/27/2023  Pt will be independent with HEP to improve strength and decrease neck pain to improve pain-free function at home and work. Baseline: 06/04/23: Baseline HEP initiated, Med[employer] handout provided  Goal status: INITIAL   LONG TERM GOALS: Target date: 07/11/2023  Pt will increase FOTO to at least 69 to demonstrate significant improvement in function at home and work related to neck pain  Baseline: 06/04/23: 59 Goal status: INITIAL  2.  Pt will decrease worst neck pain by at least 2 points on the NPRS in order to demonstrate clinically significant reduction in neck pain. Baseline: 06/04/23: 7-8/10 at worst Goal status: INITIAL  3.  Pt will demonstrate cervical spine AROM to 60 deg or greater for bilat rotation without reproduction of pain and no pain with lateral flexion in either direction as needed for  scanning environment and coupled C-spine movement of sidebend/rotation during AROM.   Baseline: 06/04/23: Motion loss and pain with R rotation, pain with R lateral flexion.  Goal status: INITIAL  4.  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  Baseline: 06/04/23: Pain with repeated reaching/movement of R arm and with catching students while spotting during dance classes. Goal status: INITIAL   PLAN: PT FREQUENCY: 1-2x/week  PT DURATION: 6 weeks  PLANNED INTERVENTIONS: Therapeutic exercises, Therapeutic activity, Neuromuscular re-education, Balance training, Gait training, Patient/Family education, Self Care, Joint mobilization, Joint manipulation, Vestibular training, Canalith repositioning, Orthotic/Fit training, DME instructions, Dry Needling, Electrical stimulation, Spinal manipulation, Spinal mobilization, Cryotherapy, Moist heat, Taping, Traction, Ultrasound, Ionotophoresis 4mg /ml Dexamethasone , Manual therapy, and Re-evaluation.  PLAN FOR NEXT SESSION: Manual therapy/STM/DTM and consideration of dry needling for R paracervical/periscapular musculature. Use of MET and SNAGs to facilitate improved cervical spine AROM. Check passive accessories and ULTT next visit.     Venetia Endo, PT, DPT #[id number]  Venetia ONEIDA Endo, PT 06/06/2023, 1:39 PM

## 2023-06-04 ENCOUNTER — Ambulatory Visit: Payer: 59 | Attending: Nurse Practitioner | Admitting: Physical Therapy

## 2023-06-04 DIAGNOSIS — M6281 Muscle weakness (generalized): Secondary | ICD-10-CM | POA: Diagnosis present

## 2023-06-04 DIAGNOSIS — M542 Cervicalgia: Secondary | ICD-10-CM | POA: Diagnosis present

## 2023-06-04 DIAGNOSIS — M25511 Pain in right shoulder: Secondary | ICD-10-CM | POA: Diagnosis present

## 2023-06-06 ENCOUNTER — Encounter: Payer: Self-pay | Admitting: Physical Therapy

## 2023-06-09 ENCOUNTER — Ambulatory Visit: Payer: 59 | Admitting: Physical Therapy

## 2023-06-09 DIAGNOSIS — M542 Cervicalgia: Secondary | ICD-10-CM

## 2023-06-09 DIAGNOSIS — M25511 Pain in right shoulder: Secondary | ICD-10-CM

## 2023-06-09 NOTE — Therapy (Signed)
 OUTPATIENT PHYSICAL THERAPY TREATMENT   Patient Name: Barbara Ortega MRN: [id number] DOB:December 27, 1975, 48 y.o., female Today's Date: 06/09/2023  END OF SESSION:  PT End of Session - 06/09/23 1505     Visit Number 2    Number of Visits 13    Date for PT Re-Evaluation 07/16/23    PT Start Time 1505    PT Stop Time 1546    PT Time Calculation (min) 41 min    Behavior During Therapy [facility] for tasks assessed/performed              Past Medical History:  Diagnosis Date   Alpha-1-antitrypsin deficiency (HCC)    Basal cell carcinoma 04/29/2008   Left medial infraocular   Dysplastic nevus 01/24/2009   Left 3rd distal toe, periungual. Moderate atypia, close to margin   Hypertension    Migraine    Sinus disease    Past Surgical History:  Procedure Laterality Date   BREAST BIOPSY Right 2019   Benign   BREAST BIOPSY Left 2012   benign   COLONOSCOPY WITH PROPOFOL  N/A 01/02/2021   Procedure: COLONOSCOPY WITH PROPOFOL ;  Surgeon: Janalyn Keene NOVAK, MD;  Location: [facility];  Service: Endoscopy;  Laterality: N/A;   NASAL SINUS SURGERY  2011   Dr. 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   Left knee pain 05/09/2021   Tension headache 10/26/2018   Cough 10/26/2018   Hypertriglyceridemia 10/26/2018   Hypertensive disorder 07/15/2018   Contraceptive management 09/21/2014   Alpha-1-antitrypsin deficiency (HCC) 08/22/2014   Migraine with aura 08/22/2014   Fatty infiltration of liver 08/11/2013   Essential hypertension, benign 09/07/2012   Allergic  rhinitis 08/05/2012    PCP: Maribeth Camellia MATSU, MD  REFERRING PROVIDER: Gretel App, NP  REFERRING DIAG:  M54.2 (ICD-10-CM) - Neck pain    RATIONALE FOR EVALUATION AND TREATMENT: Rehabilitation  THERAPY DIAG: Cervicalgia  Right shoulder pain, unspecified chronicity  ONSET DATE: April 2024  FOLLOW-UP APPT SCHEDULED WITH REFERRING PROVIDER: Yes ; f/u with App Gretel in April   Pertinent History Pt is a 47 year old female referred for neck/upper trap pain with referring diagnosis of trapezius strain. Atraumatic onset for neck pain last Spring. Pt reports it felt like crick in the neck as if she slept wrong. Pt reports starting chiropractic with no relief after 2 months. She reports she stopped this; she then went to PCP and was prescribed prednisone  and Meloxicam  at the time without relief. Pt had recent X-ray following referral from PCP with evidence for cervical spine degenerative changes. Pt is kindergarten and tourist information centre manager. Patient reports difficulty with looking over her R shoulder in particular. Pt reports some intermittent difficulty with swallowing pills/medication. Patient reports no dysarthria or diplopia. Patient reports no recent vertigo or N&V recently;she did have one episode of dizziness after chiropractic adjustment - this made her no longer want to pursue chiropractic treatment.  Pain:  Pain Intensity: Present: 5-6/10, Best: 5/10, Worst: 7-8/10 Pain location: tight along R upper trap at rest; pt reports radiating  to upper arm and also up to R side of jaw; R paracervical region  Pain Quality:  tight   Radiating: Yes ;R upper arm, R side of jaw Numbness/Tingling: Yes; down R upper limb, upper arm mainly  Focal Weakness: Yes; pt feels dexterity with hands has worsened; difficulty opening jars  Aggravating factors: Attempting to look over shoulder, lateral flexion to R mainly  Relieving factors: Transient relief with Tylenol  arthritis,  24-hour pain behavior: more  stiff in AM; pain doesn't change much through day  History of prior neck injury, pain, surgery, or therapy: Yes; hx of chiropractic, Hx of prednisone  taper and Meloxicam  Dominant hand: right Imaging: Yes ;  CLINICAL DATA:  pain   EXAM: CERVICAL SPINE - COMPLETE 5 VIEW   COMPARISON:  None Available.   FINDINGS: No fracture, dislocation or subluxation. No spondylolisthesis. No osteolytic or osteoblastic changes. Prevertebral and cervical cranial soft tissues are unremarkable.   Degenerative disc disease noted with disc space narrowing and marginal osteophytes at C4-C6.   IMPRESSION: Degenerative changes. No acute osseous abnormalities.    Red flags (personal history of cancer, h/o spinal tumors, history of compression fracture, chills/fever, night sweats, nausea, vomiting, unrelenting pain): Night sweats due to menopause   PRECAUTIONS: None  WEIGHT BEARING RESTRICTIONS: No  FALLS: Has patient fallen in last 6 months? No  Living Environment Lives with: lives with their spouse and lives with their son Lives in: House/apartment  Prior level of function: Independent  Occupational demands: Midwife; Dance (jazz, administrator, sports)  Hobbies: Walking regimen with her husband   Patient Goals: Improved pain, neck/shoulder feeling better   OBJECTIVE:   Patient Surveys  FOTO 59/predicted outcome score of 69   Posture Inc thoracic kyphosis, pt rests in moderate cervical protraction, rounded shoulders   AROM AROM (Normal range in degrees) AROM 06/04/23  Cervical  Flexion (50) 40  Extension (80) 47  Right lateral flexion (45) 38*  Left lateral flexion (45) 32 (tight)  Right rotation (85) 55*  Left rotation (85) 71  (* = pain; Blank rows = not tested)   Shoulder AROM WFL for all planes of motion, no pain reproduced or exacerbated   MMT MMT (out of 5) Right 06/04/23 Left 06/04/23      Shoulder   Flexion 4+ 4+  Extension    Abduction 4+* 4+  Internal rotation 5  5  External rotation  4* 4  Horizontal abduction    Horizontal adduction    Lower Trapezius    Rhomboids        Elbow  Flexion 5 5  Extension 5 5  Pronation    Supination        Wrist  Flexion 5 5  Extension 5 5  Radial deviation    Ulnar deviation        (* = pain; Blank rows = not tested)   Palpation Location LEFT  RIGHT           Suboccipitals 0 0  Cervical paraspinals 0 0  Upper Trapezius 0 1  Levator Scapulae 0 1  Rhomboid Major/Minor 0 1  (Blank rows = not tested) Graded on 0-4 scale (0 = no pain, 1 = pain, 2 = pain with wincing/grimacing/flinching, 3 = pain with withdrawal, 4 = unwilling to allow palpation), (Blank rows = not tested)  Repeated Movements Repeated cervical retraction: no effect during, no effect after    Passive Accessory Intervertebral Motion Will complete next visit     SPECIAL TESTS Spurlings A (  ipsilateral lateral flexion/axial compression): R: Positive L: Negative Distraction Test: Negative  Hoffman Sign (cervical cord compression): R: Negative L: Negative  Updated 06/09/23 ULTT Median: R: Negative  L: Negative ULTT Ulnar: R: Negative L: Negative ULTT Radial: R: Negative L: Negative      TODAY'S TREATMENT    SUBJECTIVE STATEMENT:   Patient reports symptoms are the same at arrival. Pt reports pain affecting R upper trap and R cervical paraspinal region at arrival. No paresthesias today/yesterday. Pt reports no issue with HEP.     Manual Therapy - for symptom modulation, soft tissue sensitivity and mobility, joint mobility, ROM   ULTT (see above)  -negative for median/radial/ulnar nerve BUE  STM/DTM R upper trapezius, R levator scapulae, R splenius cervicis C5-7 x 15 minutes    Passive upper trapezius stretching (focus on L sidebend/stretching R UT); 2 x 30 sec holds    Trigger Point Dry Needling  Initial Treatment: Pt instructed on Dry Needling rational, procedures, and possible side effects. Pt instructed to expect  mild to moderate muscle soreness later in the day and/or into the next day.  Pt instructed in methods to reduce muscle soreness. Pt instructed to continue prescribed HEP. Because Dry Needling was performed over or adjacent to a lung field, pt was educated on S/S of pneumothorax and to seek immediate medical attention should they occur.  Patient verbalized understanding of these instructions and education.   Patient Verbal Consent Given: Yes Education Handout Provided: Yes Muscles Treated: R upper trapezius x 2 and R levator scapulae with 0.30 x 40 mm needles  Electrical Stimulation Performed: No Treatment Response/Outcome: Mild post-DN soreness and stiffness remaining along R paracervical region     Therapeutic Exercise - for improved soft tissue flexibility and extensibility as needed for ROM, improved strength as needed to improve performance of CKC activities/functional movements  Seated cervical retraction; 1x10, 1 sec  -some discomfort in R upper trap during; symptoms localized along R upper trap after   Seated levator scapulae stretch; 2 x 30 sec holds  PATIENT EDUCATION: Discussed post-treatment expectations and continuing trials of DN pending treatment effect. We discussed holding on stretches/movements that increase peripheral symptoms or R upper arm pain.      PATIENT EDUCATION:  Education details: see above for patient education details Person educated: Patient Education method: Explanation, Demonstration, and Handouts Education comprehension: verbalized understanding and returned demonstration   HOME EXERCISE PROGRAM:  Access Code: [facility] URL: [email or web address] Date: 06/04/2023 Prepared by: Venetia Endo  Exercises - Seated Cervical Retraction  - 3 x daily - 7 x weekly - 2 sets - 10 reps - 1sec hold - Seated Upper Trapezius Stretch  - 2 x daily - 7 x weekly - 3 sets - 30sec hold - Seated Levator Scapulae Stretch  - 2 x daily - 7 x weekly -  3 sets - 30sec hold    ASSESSMENT:  CLINICAL IMPRESSION: Patient has same symptoms affecting R upper trapezius region and R upper periscapular musculature this afternoon without noted paresthesias recently. Pt denies arm pain this afternoon; pain is largely localized along R upper trapezius. Symptoms to not peripheralize with repeated cervical retraction and remain localized in same area. Pt has taut/tender R upper trap; we utilized dry needling today for symptom modulation with mild pain during treatment and mild to moderate soreness post-needling. 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 Pt will continue to benefit from skilled PT services to address deficits and improve function.   OBJECTIVE IMPAIRMENTS: decreased ROM, hypomobility, impaired flexibility, impaired UE functional use, and postural dysfunction.   ACTIVITY LIMITATIONS: lifting, sitting, standing, transfers, and scanning environment  PARTICIPATION LIMITATIONS: driving, community activity, and occupation  PERSONAL FACTORS: Past/current experiences, Time since onset of injury/illness/exacerbation, and 2 comorbidities (HTN and migraine history) are also affecting patient's functional outcome.   REHAB POTENTIAL: Good  CLINICAL DECISION MAKING: Evolving/moderate complexity  EVALUATION COMPLEXITY: Moderate   GOALS: Goals reviewed with patient? Yes  SHORT TERM GOALS: Target date: 06/27/2023  Pt will be independent with HEP to improve strength and decrease neck pain to improve pain-free function at home and work. Baseline: 06/04/23: Baseline HEP initiated, Med[employer] handout provided  Goal status: INITIAL   LONG TERM GOALS: Target date: 07/11/2023  Pt will increase FOTO to at least 69 to demonstrate significant improvement in function at home and  work related to neck pain  Baseline: 06/04/23: 59 Goal status: INITIAL  2.  Pt will decrease worst neck pain by at least 2 points on the NPRS in order to demonstrate clinically significant reduction in neck pain. Baseline: 06/04/23: 7-8/10 at worst Goal status: INITIAL  3.  Pt will demonstrate cervical spine AROM to 60 deg or greater for bilat rotation without reproduction of pain and no pain with lateral flexion in either direction as needed for scanning environment and coupled C-spine movement of sidebend/rotation during AROM.   Baseline: 06/04/23: Motion loss and pain with R rotation, pain with R lateral flexion.  Goal status: INITIAL  4.  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  Baseline: 06/04/23: Pain with repeated reaching/movement of R arm and with catching students while spotting during dance classes. Goal status: INITIAL   PLAN: PT FREQUENCY: 1-2x/week  PT DURATION: 6 weeks  PLANNED INTERVENTIONS: Therapeutic exercises, Therapeutic activity, Neuromuscular re-education, Balance training, Gait training, Patient/Family education, Self Care, Joint mobilization, Joint manipulation, Vestibular training, Canalith repositioning, Orthotic/Fit training, DME instructions, Dry Needling, Electrical stimulation, Spinal manipulation, Spinal mobilization, Cryotherapy, Moist heat, Taping, Traction, Ultrasound, Ionotophoresis 4mg /ml Dexamethasone , Manual therapy, and Re-evaluation.  PLAN FOR NEXT SESSION: Manual therapy/STM/DTM for R paracervical/periscapular musculature. F/u on use of dry needling and treatment effect. Use of MET and SNAGs to facilitate improved cervical spine AROM. Check passive accessories and ULTT next visit.     Venetia Endo, PT, DPT #[id number]  Venetia ONEIDA Endo, PT 06/09/2023, 3:05 PM

## 2023-06-16 ENCOUNTER — Ambulatory Visit: Payer: 59

## 2023-06-16 ENCOUNTER — Encounter: Payer: Self-pay | Admitting: Physical Therapy

## 2023-06-16 DIAGNOSIS — M542 Cervicalgia: Secondary | ICD-10-CM | POA: Diagnosis not present

## 2023-06-16 DIAGNOSIS — M25511 Pain in right shoulder: Secondary | ICD-10-CM

## 2023-06-16 DIAGNOSIS — M6281 Muscle weakness (generalized): Secondary | ICD-10-CM

## 2023-06-16 NOTE — Therapy (Signed)
OUTPATIENT PHYSICAL THERAPY TREATMENT   Patient Name: Barbara Ortega MRN: [id number] DOB:[birth year], 48 y.o., female Today's Date: 06/16/2023  END OF SESSION:  PT End of Session - 06/16/23 1653     Visit Number 3    Number of Visits 13    Date for PT Re-Evaluation 07/16/23    PT Start Time 1500    PT Stop Time 1543    PT Time Calculation (min) 43 min    Activity Tolerance Patient tolerated treatment well    Behavior During Therapy [facility] for tasks assessed/performed               Past Medical History:  Diagnosis Date   Alpha-1-antitrypsin deficiency (HCC)    Basal cell carcinoma 04/29/2008   Left medial infraocular   Dysplastic nevus 01/24/2009   Left 3rd distal toe, periungual. Moderate atypia, close to margin   Hypertension    Migraine    Sinus disease    Past Surgical History:  Procedure Laterality Date   BREAST BIOPSY Right 2019   Benign   BREAST BIOPSY Left 2012   benign   COLONOSCOPY WITH PROPOFOL N/A 01/02/2021   Procedure: COLONOSCOPY WITH PROPOFOL;  Surgeon: Pasty Spillers, MD;  Location: [facility];  Service: Endoscopy;  Laterality: N/A;   NASAL SINUS SURGERY  2011   Dr. 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   Left knee pain 05/09/2021   Tension headache 10/26/2018   Cough 10/26/2018   Hypertriglyceridemia 10/26/2018   Hypertensive disorder 07/15/2018   Contraceptive management 09/21/2014   Alpha-1-antitrypsin deficiency (HCC) 08/22/2014   Migraine with aura 08/22/2014   Fatty infiltration of liver  08/11/2013   Essential hypertension, benign 09/07/2012   Allergic rhinitis 08/05/2012    PCP: Glori Luis, MD  REFERRING PROVIDER: [name], NP  REFERRING DIAG:  M54.2 (ICD-10-CM) - Neck pain    RATIONALE FOR EVALUATION AND TREATMENT: Rehabilitation  THERAPY DIAG: Muscle weakness (generalized)  Cervicalgia  Right shoulder pain, unspecified chronicity  ONSET DATE: April 2024  FOLLOW-UP APPT SCHEDULED WITH REFERRING PROVIDER: Yes ; f/u with [name] in April   Pertinent History Pt is a 48 year old female referred for neck/upper trap pain with referring diagnosis of trapezius strain. Atraumatic onset for neck pain last Spring. Pt reports it felt like "crick in the neck" as if she slept wrong. Pt reports starting chiropractic with no relief after 2 months. She reports she stopped this; she then went to PCP and was prescribed prednisone and Meloxicam at the time without relief. Pt had recent X-ray following referral from PCP with evidence for cervical spine degenerative changes. Pt is kindergarten and Tourist information centre manager. Patient reports difficulty with looking over her R shoulder in particular. Pt reports some intermittent difficulty with swallowing pills/medication. Patient reports no dysarthria or diplopia. Patient reports no recent vertigo or N&V recently;she did have one episode of dizziness after chiropractic adjustment - this made her no longer want to pursue chiropractic treatment.  Pain:  Pain Intensity: Present: 5-6/10, Best: 5/10,  Worst: 7-8/10 Pain location: "tight" along R upper trap at rest; pt reports radiating to upper arm and also up to R side of jaw; R paracervical region  Pain Quality:  "tight"   Radiating: Yes ;R upper arm, R side of jaw Numbness/Tingling: Yes; down R upper limb, upper arm mainly  Focal Weakness: Yes; pt feels dexterity with hands has worsened; difficulty opening jars  Aggravating factors: Attempting to look over shoulder, lateral flexion to R  mainly  Relieving factors: Transient relief with Tylenol arthritis,  24-hour pain behavior: more stiff in AM; pain doesn't change much through day  History of prior neck injury, pain, surgery, or therapy: Yes; hx of chiropractic, Hx of prednisone taper and Meloxicam Dominant hand: right Imaging: Yes ;  CLINICAL DATA:  pain   EXAM: CERVICAL SPINE - COMPLETE 5 VIEW   COMPARISON:  None Available.   FINDINGS: No fracture, dislocation or subluxation. No spondylolisthesis. No osteolytic or osteoblastic changes. Prevertebral and cervical cranial soft tissues are unremarkable.   Degenerative disc disease noted with disc space narrowing and marginal osteophytes at C4-C6.   IMPRESSION: Degenerative changes. No acute osseous abnormalities.    Red flags (personal history of cancer, h/o spinal tumors, history of compression fracture, chills/fever, night sweats, nausea, vomiting, unrelenting pain): Night sweats due to menopause   PRECAUTIONS: None  WEIGHT BEARING RESTRICTIONS: No  FALLS: Has patient fallen in last 6 months? No  Living Environment Lives with: lives with their spouse and lives with their son Lives in: House/apartment  Prior level of function: Independent  Occupational demands: Midwife; Dance (jazz, Administrator, sports)  Hobbies: Walking regimen with her husband   Patient Goals: Improved pain, neck/shoulder feeling better   OBJECTIVE:   Patient Surveys  FOTO 59/predicted outcome score of 69   Posture Inc thoracic kyphosis, pt rests in moderate cervical protraction, rounded shoulders   AROM AROM (Normal range in degrees) AROM 06/04/23  Cervical  Flexion (50) 40  Extension (80) 47  Right lateral flexion (45) 38*  Left lateral flexion (45) 32 ("tight")  Right rotation (85) 55*  Left rotation (85) 71  (* = pain; Blank rows = not tested)   Shoulder AROM WFL for all planes of motion, no pain reproduced or exacerbated   MMT MMT (out of 5) Right 06/04/23  Left 06/04/23      Shoulder   Flexion 4+ 4+  Extension    Abduction 4+* 4+  Internal rotation 5 5  External rotation  4* 4  Horizontal abduction    Horizontal adduction    Lower Trapezius    Rhomboids        Elbow  Flexion 5 5  Extension 5 5  Pronation    Supination        Wrist  Flexion 5 5  Extension 5 5  Radial deviation    Ulnar deviation        (* = pain; Blank rows = not tested)   Palpation Location LEFT  RIGHT           Suboccipitals 0 0  Cervical paraspinals 0 0  Upper Trapezius 0 1  Levator Scapulae 0 1  Rhomboid Major/Minor 0 1  (Blank rows = not tested) Graded on 0-4 scale (0 = no pain, 1 = pain, 2 = pain with wincing/grimacing/flinching, 3 = pain with withdrawal, 4 = unwilling to allow palpation), (Blank rows = not tested)  Repeated Movements Repeated cervical retraction: no effect during, no effect after    Passive Accessory  Intervertebral Motion Will complete next visit     SPECIAL TESTS Spurlings A (ipsilateral lateral flexion/axial compression): R: Positive L: Negative Distraction Test: Negative  Hoffman Sign (cervical cord compression): R: Negative L: Negative  Updated 06/09/23 ULTT Median: R: Negative  L: Negative ULTT Ulnar: R: Negative L: Negative ULTT Radial: R: Negative L: Negative      TODAY'S TREATMENT    SUBJECTIVE STATEMENT:   " I feel better but it still hurts. It feels stuck in my R side of the neck when I turn my head to R. I feel tight."Pt reports no issue with HEP.     Manual Therapy - for symptom modulation, soft tissue sensitivity and mobility, joint mobility, ROM   ULTT (see above)  -negative for median/radial/ulnar nerve BUE  STM/DTM R upper trapezius, R levator scapulae, R splenius cervicis C5-7 x 15 minutes    Passive upper trapezius stretching (focus on L sidebend/stretching R UT); 2 x 30 sec holds  Manual Pect stretch in supine. 30 secs hold x 3     Therapeutic Exercise - for improved soft tissue  flexibility and extensibility as needed for ROM, improved strength as needed to improve performance of CKC activities/functional movements  Seated cervical retraction; 3 mins hold 1 min off x 3   -Pt asymptomatic  Seated levator scapulae stretch; 2 x 30 sec holds Scalene ant and Posterior stretch in supine 2 x 60 secs hold each Ant/ost Scalene AROM against gravity in L side lying 2 x 10 reps each Chin tuck 2 x 10 reps Scapualr retraction #2 2 x 10 reps Shldr shrugs with post depression #2 2 x 10 reps  HEP updated and provide din H/O.   PATIENT EDUCATION:   Not today:   Trigger Point Dry Needling  Initial Treatment: Pt instructed on Dry Needling rational, procedures, and possible side effects. Pt instructed to expect mild to moderate muscle soreness later in the day and/or into the next day.  Pt instructed in methods to reduce muscle soreness. Pt instructed to continue prescribed HEP. Because Dry Needling was performed over or adjacent to a lung field, pt was educated on S/S of pneumothorax and to seek immediate medical attention should they occur.  Patient verbalized understanding of these instructions and education.   Patient Verbal Consent Given: Yes Education Handout Provided: Yes Muscles Treated: R upper trapezius x 2 and R levator scapulae with 0.30 x 40 mm needles  Electrical Stimulation Performed: No Treatment Response/Outcome: Mild post-DN soreness and stiffness remaining along R paracervical region  PATIENT EDUCATION:  Education details: see above for patient education details Person educated: Patient Education method: Explanation, Demonstration, and Handouts Education comprehension: verbalized understanding and returned demonstration   HOME EXERCISE PROGRAM:   Access Code: [facility] URL: [email or web address] Date: 06/16/2023 Prepared by: Janet Berlin  Exercises - Supine Posterior Scalene Stretch  - 1 x daily - 7 x weekly - 3 sets - 10 reps -  Supine Anterior Scalene Stretch  - 1 x daily - 7 x weekly - 3 sets - 10 reps - Seated Scapular Retraction  - 1 x daily - 7 x weekly - 3 sets - 10 reps - Standing Shoulder Shrugs with Dumbbells  - 1 x daily - 7 x weekly - 3 sets - 10 reps Access Code: [facility] URL: [email or web address] Date: 06/04/2023 Prepared by: Consuela Mimes  Exercises - Seated Cervical Retraction  - 3 x daily - 7 x weekly - 2 sets - 10 reps - 1sec hold - Seated Upper  Trapezius Stretch  - 2 x daily - 7 x weekly - 3 sets - 30sec hold - Seated Levator Scapulae Stretch  - 2 x daily - 7 x weekly - 3 sets - 30sec hold    ASSESSMENT:  CLINICAL IMPRESSION: Patient presents with deep neck pain and tightness. PT found Both pectoralis tightness and scalene tightness. Also found that the  C2/C3/C4 has up slip in the R facet joint. C/S mobilization and traction to reduce up slip provided which benefited the pt. Continues to have end range pain. Introduced pt to scapular strengthening exs. PT introduced pt to Scalene stretch and major and minor tightness. Updated HEP provided in H/O. Plan to provide  Pectoralis stretch in HEP next visit. PT provided online pictures of C/S home traction unit to purchase based on pt benefiting from C/S traction unit in the clinic. Pt plans to bring it next session if arrives by then. Pt will continue to benefit from skilled PT services to address deficits and improve function.   OBJECTIVE IMPAIRMENTS: decreased ROM, hypomobility, impaired flexibility, impaired UE functional use, and postural dysfunction.   ACTIVITY LIMITATIONS: lifting, sitting, standing, transfers, and scanning environment  PARTICIPATION LIMITATIONS: driving, community activity, and occupation  PERSONAL FACTORS: Past/current experiences, Time since onset of injury/illness/exacerbation, and 2 comorbidities (HTN and migraine history) are also affecting patient's functional outcome.   REHAB POTENTIAL: Good  CLINICAL  DECISION MAKING: Evolving/moderate complexity  EVALUATION COMPLEXITY: Moderate   GOALS: Goals reviewed with patient? Yes  SHORT TERM GOALS: Target date: 06/27/2023  Pt will be independent with HEP to improve strength and decrease neck pain to improve pain-free function at home and work. Baseline: 06/04/23: Baseline HEP initiated, Med[employer] handout provided  Goal status: INITIAL   LONG TERM GOALS: Target date: 07/11/2023  Pt will increase FOTO to at least 69 to demonstrate significant improvement in function at home and work related to neck pain  Baseline: 06/04/23: 59 Goal status: INITIAL  2.  Pt will decrease worst neck pain by at least 2 points on the NPRS in order to demonstrate clinically significant reduction in neck pain. Baseline: 06/04/23: 7-8/10 at worst Goal status: INITIAL  3.  Pt will demonstrate cervical spine AROM to 60 deg or greater for bilat rotation without reproduction of pain and no pain with lateral flexion in either direction as needed for scanning environment and coupled C-spine movement of sidebend/rotation during AROM.   Baseline: 06/04/23: Motion loss and pain with R rotation, pain with R lateral flexion.  Goal status: INITIAL  4.  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  Baseline: 06/04/23: Pain with repeated reaching/movement of R arm and with catching students while spotting during dance classes. Goal status: INITIAL   PLAN: PT FREQUENCY: 1-2x/week  PT DURATION: 6 weeks  PLANNED INTERVENTIONS: Therapeutic exercises, Therapeutic activity, Neuromuscular re-education, Balance training, Gait training, Patient/Family education, Self Care, Joint mobilization, Joint manipulation, Vestibular training, Canalith repositioning, Orthotic/Fit training, DME instructions, Dry Needling,  Electrical stimulation, Spinal manipulation, Spinal mobilization, Cryotherapy, Moist heat, Taping, Traction, Ultrasound, Ionotophoresis 4mg /ml Dexamethasone, Manual therapy, and Re-evaluation.  PLAN FOR NEXT SESSION: Manual therapy/STM/DTM for R paracervical/periscapular musculature. F/u on use of dry needling and treatment effect. Use of MET and SNAGs to facilitate improved cervical spine AROM. Check passive accessories and ULTT next visit.    Janet Berlin PT DPT 4:55 PM,06/16/23

## 2023-06-17 ENCOUNTER — Other Ambulatory Visit: Payer: Self-pay | Admitting: Family Medicine

## 2023-06-17 ENCOUNTER — Encounter: Payer: 59 | Admitting: Physical Therapy

## 2023-06-17 DIAGNOSIS — R232 Flushing: Secondary | ICD-10-CM

## 2023-06-18 ENCOUNTER — Ambulatory Visit: Payer: 59

## 2023-06-18 DIAGNOSIS — M542 Cervicalgia: Secondary | ICD-10-CM | POA: Diagnosis not present

## 2023-06-18 DIAGNOSIS — M25511 Pain in right shoulder: Secondary | ICD-10-CM

## 2023-06-18 DIAGNOSIS — M6281 Muscle weakness (generalized): Secondary | ICD-10-CM

## 2023-06-18 NOTE — Therapy (Signed)
OUTPATIENT PHYSICAL THERAPY TREATMENT   Patient Name: Barbara Ortega MRN: [id number] DOB:26-Feb-1976, 48 y.o., female Today's Date: 06/18/2023  END OF SESSION:  PT End of Session - 06/18/23 1239     Visit Number 4    Number of Visits 13    Date for PT Re-Evaluation 07/16/23    PT Start Time 0945    PT Stop Time 1030    PT Time Calculation (min) 45 min    Activity Tolerance Patient tolerated treatment well    Behavior During Therapy [facility] for tasks assessed/performed                Past Medical History:  Diagnosis Date   Alpha-1-antitrypsin deficiency (HCC)    Basal cell carcinoma 04/29/2008   Left medial infraocular   Dysplastic nevus 01/24/2009   Left 3rd distal toe, periungual. Moderate atypia, close to margin   Hypertension    Migraine    Sinus disease    Past Surgical History:  Procedure Laterality Date   BREAST BIOPSY Right 2019   Benign   BREAST BIOPSY Left 2012   benign   COLONOSCOPY WITH PROPOFOL N/A 01/02/2021   Procedure: COLONOSCOPY WITH PROPOFOL;  Surgeon: Pasty Spillers, MD;  Location: [facility];  Service: Endoscopy;  Laterality: N/A;   NASAL SINUS SURGERY  2011   Dr. 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   Left knee pain 05/09/2021   Tension headache 10/26/2018   Cough 10/26/2018   Hypertriglyceridemia 10/26/2018   Hypertensive disorder 07/15/2018   Contraceptive management 09/21/2014   Alpha-1-antitrypsin deficiency (HCC) 08/22/2014   Migraine with aura 08/22/2014   Fatty infiltration of liver  08/11/2013   Essential hypertension, benign 09/07/2012   Allergic rhinitis 08/05/2012    PCP: Glori Luis, MD  REFERRING PROVIDER: [name], NP  REFERRING DIAG:  M54.2 (ICD-10-CM) - Neck pain    RATIONALE FOR EVALUATION AND TREATMENT: Rehabilitation  THERAPY DIAG: Cervicalgia  Muscle weakness (generalized)  Right shoulder pain, unspecified chronicity  ONSET DATE: April 2024  FOLLOW-UP APPT SCHEDULED WITH REFERRING PROVIDER: Yes ; f/u with [name] in April   Pertinent History Pt is a 48 year old female referred for neck/upper trap pain with referring diagnosis of trapezius strain. Atraumatic onset for neck pain last Spring. Pt reports it felt like "crick in the neck" as if she slept wrong. Pt reports starting chiropractic with no relief after 2 months. She reports she stopped this; she then went to PCP and was prescribed prednisone and Meloxicam at the time without relief. Pt had recent X-ray following referral from PCP with evidence for cervical spine degenerative changes. Pt is kindergarten and Tourist information centre manager. Patient reports difficulty with looking over her R shoulder in particular. Pt reports some intermittent difficulty with swallowing pills/medication. Patient reports no dysarthria or diplopia. Patient reports no recent vertigo or N&V recently;she did have one episode of dizziness after chiropractic adjustment - this made her no longer want to pursue chiropractic treatment.  Pain:  Pain Intensity: Present: 5-6/10, Best:  5/10, Worst: 7-8/10 Pain location: "tight" along R upper trap at rest; pt reports radiating to upper arm and also up to R side of jaw; R paracervical region  Pain Quality:  "tight"   Radiating: Yes ;R upper arm, R side of jaw Numbness/Tingling: Yes; down R upper limb, upper arm mainly  Focal Weakness: Yes; pt feels dexterity with hands has worsened; difficulty opening jars  Aggravating factors: Attempting to look over shoulder, lateral flexion to R  mainly  Relieving factors: Transient relief with Tylenol arthritis,  24-hour pain behavior: more stiff in AM; pain doesn't change much through day  History of prior neck injury, pain, surgery, or therapy: Yes; hx of chiropractic, Hx of prednisone taper and Meloxicam Dominant hand: right Imaging: Yes ;  CLINICAL DATA:  pain   EXAM: CERVICAL SPINE - COMPLETE 5 VIEW   COMPARISON:  None Available.   FINDINGS: No fracture, dislocation or subluxation. No spondylolisthesis. No osteolytic or osteoblastic changes. Prevertebral and cervical cranial soft tissues are unremarkable.   Degenerative disc disease noted with disc space narrowing and marginal osteophytes at C4-C6.   IMPRESSION: Degenerative changes. No acute osseous abnormalities.    Red flags (personal history of cancer, h/o spinal tumors, history of compression fracture, chills/fever, night sweats, nausea, vomiting, unrelenting pain): Night sweats due to menopause   PRECAUTIONS: None  WEIGHT BEARING RESTRICTIONS: No  FALLS: Has patient fallen in last 6 months? No  Living Environment Lives with: lives with their spouse and lives with their son Lives in: House/apartment  Prior level of function: Independent  Occupational demands: Midwife; Dance (jazz, Administrator, sports)  Hobbies: Walking regimen with her husband   Patient Goals: Improved pain, neck/shoulder feeling better   OBJECTIVE:   Patient Surveys  FOTO 59/predicted outcome score of 69   Posture Inc thoracic kyphosis, pt rests in moderate cervical protraction, rounded shoulders   AROM AROM (Normal range in degrees) AROM 06/04/23  Cervical  Flexion (50) 40  Extension (80) 47  Right lateral flexion (45) 38*  Left lateral flexion (45) 32 ("tight")  Right rotation (85) 55*  Left rotation (85) 71  (* = pain; Blank rows = not tested)   Shoulder AROM WFL for all planes of motion, no pain reproduced or exacerbated   MMT MMT (out of 5) Right 06/04/23  Left 06/04/23      Shoulder   Flexion 4+ 4+  Extension    Abduction 4+* 4+  Internal rotation 5 5  External rotation  4* 4  Horizontal abduction    Horizontal adduction    Lower Trapezius    Rhomboids        Elbow  Flexion 5 5  Extension 5 5  Pronation    Supination        Wrist  Flexion 5 5  Extension 5 5  Radial deviation    Ulnar deviation        (* = pain; Blank rows = not tested)   Palpation Location LEFT  RIGHT           Suboccipitals 0 0  Cervical paraspinals 0 0  Upper Trapezius 0 1  Levator Scapulae 0 1  Rhomboid Major/Minor 0 1  (Blank rows = not tested) Graded on 0-4 scale (0 = no pain, 1 = pain, 2 = pain with wincing/grimacing/flinching, 3 = pain with withdrawal, 4 = unwilling to allow palpation), (Blank rows = not tested)  Repeated Movements Repeated cervical retraction: no effect during, no effect after    Passive  Accessory Intervertebral Motion Will complete next visit     SPECIAL TESTS Spurlings A (ipsilateral lateral flexion/axial compression): R: Positive L: Negative Distraction Test: Negative  Hoffman Sign (cervical cord compression): R: Negative L: Negative  Updated 06/09/23 ULTT Median: R: Negative  L: Negative ULTT Ulnar: R: Negative L: Negative ULTT Radial: R: Negative L: Negative      TODAY'S TREATMENT    SUBJECTIVE STATEMENT:   " I feel better but it still hurts. It feels stuck in my R side of the neck when I turn my head to R. I feel tight."Pt reports no issue with HEP.     Manual Therapy - for symptom modulation, soft tissue sensitivity and mobility, joint mobility, ROM   STM/DTM R upper trapezius, R levator scapulae, R splenius cervicis C5-7 x 15 minutes  Passive upper trapezius stretching (focus on L sidebend/stretching R UT); 2 x 30 sec holds  Manual Pect stretch in supine. 30 secs hold x 3     Therapeutic Exercise - for improved soft tissue flexibility and extensibility as needed for ROM, improved strength as  needed to improve performance of CKC activities/functional movements  Seated cervical retraction; 5 mins hold x2 30secs off, 3 mins on x 1    -Pt asymptomatic.  ROM assessed and found end range tightness  and no pain or catching sensation.  Seated levator scapulae stretch; 2 x 30 sec holds Scalene ant and Posterior stretch in supine 2 x 60 secs hold each Ant/post Scalene AROM against gravity in L side lying 2 x 10 reps each Pect stretch in corner 60 secs hold Chin tuck 2 x 10 reps Scapualr retraction #4 and BTB  2 x 10 reps Shldr shrugs with post depression with BTB  2 x 10 reps  HEP updated and provide din H/O. P  PATIENT EDUCATION:   Not today:   Trigger Point Dry Needling  Initial Treatment: Pt instructed on Dry Needling rational, procedures, and possible side effects. Pt instructed to expect mild to moderate muscle soreness later in the day and/or into the next day.  Pt instructed in methods to reduce muscle soreness. Pt instructed to continue prescribed HEP. Because Dry Needling was performed over or adjacent to a lung field, pt was educated on S/S of pneumothorax and to seek immediate medical attention should they occur.  Patient verbalized understanding of these instructions and education.   Patient Verbal Consent Given: Yes Education Handout Provided: Yes Muscles Treated: R upper trapezius x 2 and R levator scapulae with 0.30 x 40 mm needles  Electrical Stimulation Performed: No Treatment Response/Outcome: Mild post-DN soreness and stiffness remaining along R paracervical region  PATIENT EDUCATION:  Education details: see above for patient education details Person educated: Patient Education method: Explanation, Demonstration, and Handouts Education comprehension: verbalized understanding and returned demonstration   HOME EXERCISE PROGRAM:   Access Code: [facility] URL: [email or web address] Date: 06/16/2023 Prepared by: Janet Berlin  Exercises -  Supine Posterior Scalene Stretch  - 1 x daily - 7 x weekly - 3 sets - 10 reps - Supine Anterior Scalene Stretch  - 1 x daily - 7 x weekly - 3 sets - 10 reps - Seated Scapular Retraction  - 1 x daily - 7 x weekly - 3 sets - 10 reps - Standing Shoulder Shrugs with Dumbbells  - 1 x daily - 7 x weekly - 3 sets - 10 reps     ASSESSMENT:  CLINICAL IMPRESSION: Patient presents with no pain and mild  tightness at  the end range. PT found Both pectoralis tightness and scalene tightness. Also found that the  C2/C3/C4 has up slip has resolved.  C/S mobilization and traction to reduce up slip provided which benefited the pt. Decreased end range tightness noted. Continued with  pt to scapular strengthening exs, stretches and C/S especially scalene strengthening exs.  PT introduced pt to Scalene stretch and major and minor tightness. Updated HEP provided in H/O. Plan to provide  Pectoralis stretch in HEP and BTB provided for strengthening exs.    Pt is OK without paper exs and can use MyChart to view exs. Pt is progressing well.  PT provided online pictures of C/S home traction unit to purchase based on pt benefiting from C/S traction unit in the clinic. Pt plans to bring it next session if arrives by then. Pt will continue to benefit from skilled PT services to address deficits and improve function.   OBJECTIVE IMPAIRMENTS: decreased ROM, hypomobility, impaired flexibility, impaired UE functional use, and postural dysfunction.   ACTIVITY LIMITATIONS: lifting, sitting, standing, transfers, and scanning environment  PARTICIPATION LIMITATIONS: driving, community activity, and occupation  PERSONAL FACTORS: Past/current experiences, Time since onset of injury/illness/exacerbation, and 2 comorbidities (HTN and migraine history) are also affecting patient's functional outcome.   REHAB POTENTIAL: Good  CLINICAL DECISION MAKING: Evolving/moderate complexity  EVALUATION COMPLEXITY: Moderate   GOALS: Goals  reviewed with patient? Yes  SHORT TERM GOALS: Target date: 06/27/2023  Pt will be independent with HEP to improve strength and decrease neck pain to improve pain-free function at home and work. Baseline: 06/04/23: Baseline HEP initiated, MedBridge handout provided  Goal status: INITIAL   LONG TERM GOALS: Target date: 07/11/2023  Pt will increase FOTO to at least 69 to demonstrate significant improvement in function at home and work related to neck pain  Baseline: 06/04/23: 59 Goal status: INITIAL  2.  Pt will decrease worst neck pain by at least 2 points on the NPRS in order to demonstrate clinically significant reduction in neck pain. Baseline: 06/04/23: 7-8/10 at worst Goal status: INITIAL  3.  Pt will demonstrate cervical spine AROM to 60 deg or greater for bilat rotation without reproduction of pain and no pain with lateral flexion in either direction as needed for scanning environment and coupled C-spine movement of sidebend/rotation during AROM.   Baseline: 06/04/23: Motion loss and pain with R rotation, pain with R lateral flexion.  Goal status: INITIAL  4.  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  Baseline: 06/04/23: Pain with repeated reaching/movement of R arm and with catching students while spotting during dance classes. Goal status: INITIAL   PLAN: PT FREQUENCY: 1-2x/week  PT DURATION: 6 weeks  PLANNED INTERVENTIONS: Therapeutic exercises, Therapeutic activity, Neuromuscular re-education, Balance training, Gait training, Patient/Family education, Self Care, Joint mobilization, Joint manipulation, Vestibular training, Canalith repositioning, Orthotic/Fit training, DME instructions, Dry Needling, Electrical stimulation, Spinal manipulation, Spinal mobilization, Cryotherapy, Moist heat, Taping, Traction,  Ultrasound, Ionotophoresis 4mg /ml Dexamethasone, Manual therapy, and Re-evaluation.  PLAN FOR NEXT SESSION: Manual therapy/STM/DTM for R paracervical/periscapular musculature. F/u on use of dry needling and treatment effect. Use of MET and SNAGs to facilitate improved cervical spine AROM. Check passive accessories and ULTT next visit.    Janet Berlin PT DPT 12:40 PM,06/18/23

## 2023-06-20 NOTE — Therapy (Signed)
OUTPATIENT PHYSICAL THERAPY TREATMENT   Patient Name: Barbara Ortega MRN: [id number] DOB:1975-10-01, 48 y.o., female Today's Date: 06/23/2023  END OF SESSION:  PT End of Session - 06/23/23 1550     Visit Number 5    Number of Visits 13    Date for PT Re-Evaluation 07/16/23    PT Start Time 1549    PT Stop Time 1628    PT Time Calculation (min) 39 min    Activity Tolerance Patient tolerated treatment well    Behavior During Therapy [facility] for tasks assessed/performed                 Past Medical History:  Diagnosis Date   Alpha-1-antitrypsin deficiency (HCC)    Basal cell carcinoma 04/29/2008   Left medial infraocular   Dysplastic nevus 01/24/2009   Left 3rd distal toe, periungual. Moderate atypia, close to margin   Hypertension    Migraine    Sinus disease    Past Surgical History:  Procedure Laterality Date   BREAST BIOPSY Right 2019   Benign   BREAST BIOPSY Left 2012   benign   COLONOSCOPY WITH PROPOFOL N/A 01/02/2021   Procedure: COLONOSCOPY WITH PROPOFOL;  Surgeon: Pasty Spillers, MD;  Location: [facility];  Service: Endoscopy;  Laterality: N/A;   NASAL SINUS SURGERY  2011   Dr. 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   Left knee pain 05/09/2021   Tension headache 10/26/2018   Cough 10/26/2018   Hypertriglyceridemia 10/26/2018   Hypertensive disorder 07/15/2018   Contraceptive management 09/21/2014   Alpha-1-antitrypsin deficiency (HCC) 08/22/2014   Migraine with aura 08/22/2014   Fatty infiltration of liver  08/11/2013   Essential hypertension, benign 09/07/2012   Allergic rhinitis 08/05/2012    PCP: Glori Luis, MD  REFERRING PROVIDER: [name], NP  REFERRING DIAG:  M54.2 (ICD-10-CM) - Neck pain    RATIONALE FOR EVALUATION AND TREATMENT: Rehabilitation  THERAPY DIAG: Muscle weakness (generalized)  Right shoulder pain, unspecified chronicity  Cervicalgia  ONSET DATE: April 2024  FOLLOW-UP APPT SCHEDULED WITH REFERRING PROVIDER: Yes ; f/u with [name] in April   Pertinent History Pt is a 48 year old female referred for neck/upper trap pain with referring diagnosis of trapezius strain. Atraumatic onset for neck pain last Spring. Pt reports it felt like "crick in the neck" as if she slept wrong. Pt reports starting chiropractic with no relief after 2 months. She reports she stopped this; she then went to PCP and was prescribed prednisone and Meloxicam at the time without relief. Pt had recent X-ray following referral from PCP with evidence for cervical spine degenerative changes. Pt is kindergarten and Tourist information centre manager. Patient reports difficulty with looking over her R shoulder in particular. Pt reports some intermittent difficulty with swallowing pills/medication. Patient reports no dysarthria or diplopia. Patient reports no recent vertigo or N&V recently;she did have one episode of dizziness after chiropractic adjustment - this made her no longer want to pursue chiropractic treatment.  Pain:  Pain Intensity: Present: 5-6/10,  Best: 5/10, Worst: 7-8/10 Pain location: "tight" along R upper trap at rest; pt reports radiating to upper arm and also up to R side of jaw; R paracervical region  Pain Quality:  "tight"   Radiating: Yes ;R upper arm, R side of jaw Numbness/Tingling: Yes; down R upper limb, upper arm mainly  Focal Weakness: Yes; pt feels dexterity with hands has worsened; difficulty opening jars  Aggravating factors: Attempting to look over shoulder, lateral flexion to R  mainly  Relieving factors: Transient relief with Tylenol arthritis,  24-hour pain behavior: more stiff in AM; pain doesn't change much through day  History of prior neck injury, pain, surgery, or therapy: Yes; hx of chiropractic, Hx of prednisone taper and Meloxicam Dominant hand: right Imaging: Yes ;  CLINICAL DATA:  pain   EXAM: CERVICAL SPINE - COMPLETE 5 VIEW   COMPARISON:  None Available.   FINDINGS: No fracture, dislocation or subluxation. No spondylolisthesis. No osteolytic or osteoblastic changes. Prevertebral and cervical cranial soft tissues are unremarkable.   Degenerative disc disease noted with disc space narrowing and marginal osteophytes at C4-C6.   IMPRESSION: Degenerative changes. No acute osseous abnormalities.    Red flags (personal history of cancer, h/o spinal tumors, history of compression fracture, chills/fever, night sweats, nausea, vomiting, unrelenting pain): Night sweats due to menopause   PRECAUTIONS: None  WEIGHT BEARING RESTRICTIONS: No  FALLS: Has patient fallen in last 6 months? No  Living Environment Lives with: lives with their spouse and lives with their son Lives in: House/apartment  Prior level of function: Independent  Occupational demands: Midwife; Dance (jazz, Administrator, sports)  Hobbies: Walking regimen with her husband   Patient Goals: Improved pain, neck/shoulder feeling better   OBJECTIVE:   Patient Surveys  FOTO 59/predicted outcome score of 69   Posture Inc thoracic kyphosis, pt rests in moderate cervical protraction, rounded shoulders   AROM AROM (Normal range in degrees) AROM 06/04/23  Cervical  Flexion (50) 40  Extension (80) 47  Right lateral flexion (45) 38*  Left lateral flexion (45) 32 ("tight")  Right rotation (85) 55*  Left rotation (85) 71  (* = pain; Blank rows = not tested)   Shoulder AROM WFL for all planes of motion, no pain reproduced or exacerbated   MMT MMT (out of 5) Right 06/04/23  Left 06/04/23      Shoulder   Flexion 4+ 4+  Extension    Abduction 4+* 4+  Internal rotation 5 5  External rotation  4* 4  Horizontal abduction    Horizontal adduction    Lower Trapezius    Rhomboids        Elbow  Flexion 5 5  Extension 5 5  Pronation    Supination        Wrist  Flexion 5 5  Extension 5 5  Radial deviation    Ulnar deviation        (* = pain; Blank rows = not tested)   Palpation Location LEFT  RIGHT           Suboccipitals 0 0  Cervical paraspinals 0 0  Upper Trapezius 0 1  Levator Scapulae 0 1  Rhomboid Major/Minor 0 1  (Blank rows = not tested) Graded on 0-4 scale (0 = no pain, 1 = pain, 2 = pain with wincing/grimacing/flinching, 3 = pain with withdrawal, 4 = unwilling to allow palpation), (Blank rows = not tested)  Repeated Movements Repeated cervical retraction: no effect during, no effect after  Passive Accessory Intervertebral Motion Will complete next visit     SPECIAL TESTS Spurlings A (ipsilateral lateral flexion/axial compression): R: Positive L: Negative Distraction Test: Negative  Hoffman Sign (cervical cord compression): R: Negative L: Negative  Updated 06/09/23 ULTT Median: R: Negative  L: Negative ULTT Ulnar: R: Negative L: Negative ULTT Radial: R: Negative L: Negative      TODAY'S TREATMENT     SUBJECTIVE STATEMENT:   Patient reports 5/10 pain in R sided neck this date. Feels like she can rotate her neck better to the right while driving.    Manual Therapy - for symptom modulation, soft tissue sensitivity and mobility, joint mobility, ROM   STM/DTM suboccipitals, R upper trapezius, R levator scapulae, R splenius cervicis C5-7 x 15 minutes  Passive upper trapezius stretching (focus on L sidebend/stretching R UT); 2 x 30 sec holds   Therapeutic Exercise - for improved soft tissue flexibility and extensibility as needed for ROM, improved strength as needed to improve performance of CKC activities/functional  movements  Prone flys with 4# DB 2 x 10  Seated upper trap stretch on R with R hand anchored under buttocks 2 x 30 seconds Standing row with BTB 2 x 10  Standing shoulder extension with BTB 2 x 10   Provided HEP with standing row, shoulder extension, and horizontal abduction with resistance and provided with blue theraband for home use.    PATIENT EDUCATION:  Education details: see above for patient education details Person educated: Patient Education method: Explanation, Demonstration, and Handouts Education comprehension: verbalized understanding and returned demonstration   HOME EXERCISE PROGRAM:  Access Code: [facility] URL: [email or web address] Date: 06/04/2023 Prepared by: Consuela Mimes   Exercises - Seated Cervical Retraction  - 3 x daily - 7 x weekly - 2 sets - 10 reps - 1sec hold - Seated Upper Trapezius Stretch  - 2 x daily - 7 x weekly - 3 sets - 30sec hold - Seated Levator Scapulae Stretch  - 2 x daily - 7 x weekly - 3 sets - 30sec hold  Access Code: [facility] URL: [email or web address] Date: 06/16/2023 Prepared by: Janet Berlin  Exercises - Supine Posterior Scalene Stretch  - 1 x daily - 7 x weekly - 3 sets - 10 reps - Supine Anterior Scalene Stretch  - 1 x daily - 7 x weekly - 3 sets - 10 reps - Seated Scapular Retraction  - 1 x daily - 7 x weekly - 3 sets - 10 reps - Standing Shoulder Shrugs with Dumbbells  - 1 x daily - 7 x weekly - 3 sets - 10 reps  Access Code: [email or web address] URL: [email or web address] Date: 06/23/2023 Prepared by: Maylon Peppers  Exercises - Standing Shoulder Row with Anchored Resistance  - 2-3 x daily - 5-7 x weekly - 3 sets - 10 reps - Shoulder extension with resistance - Neutral  - 2-3 x daily - 5-7 x weekly - 3 sets - 10 reps - Standing Shoulder Horizontal Abduction with Resistance  - 2-3 x daily - 5-7 x weekly - 3 sets - 10 reps   ASSESSMENT:  CLINICAL IMPRESSION:   Patient arrives to  treatment session motivated and reports 5/10 pain in neck. Session focused on manual STM and stretching along with introduction into scapular strengthening with resistance. Updated HEP with resistance band rows, shoulder extension, and horizontal abduction. Patient will continue to benefit from skilled therapy to address remaining deficits in order to improve quality of life and return to PLOF.     OBJECTIVE  IMPAIRMENTS: decreased ROM, hypomobility, impaired flexibility, impaired UE functional use, and postural dysfunction.   ACTIVITY LIMITATIONS: lifting, sitting, standing, transfers, and scanning environment  PARTICIPATION LIMITATIONS: driving, community activity, and occupation  PERSONAL FACTORS: Past/current experiences, Time since onset of injury/illness/exacerbation, and 2 comorbidities (HTN and migraine history) are also affecting patient's functional outcome.   REHAB POTENTIAL: Good  CLINICAL DECISION MAKING: Evolving/moderate complexity  EVALUATION COMPLEXITY: Moderate   GOALS: Goals reviewed with patient? Yes  SHORT TERM GOALS: Target date: 06/27/2023  Pt will be independent with HEP to improve strength and decrease neck pain to improve pain-free function at home and work. Baseline: 06/04/23: Baseline HEP initiated, MedBridge handout provided  Goal status: INITIAL   LONG TERM GOALS: Target date: 07/11/2023  Pt will increase FOTO to at least 69 to demonstrate significant improvement in function at home and work related to neck pain  Baseline: 06/04/23: 59 Goal status: INITIAL  2.  Pt will decrease worst neck pain by at least 2 points on the NPRS in order to demonstrate clinically significant reduction in neck pain. Baseline: 06/04/23: 7-8/10 at worst Goal status: INITIAL  3.  Pt will demonstrate cervical spine AROM to 60 deg or greater for bilat rotation without reproduction of pain and no pain with lateral flexion in either direction as needed for scanning environment and  coupled C-spine movement of sidebend/rotation during AROM.   Baseline: 06/04/23: Motion loss and pain with R rotation, pain with R lateral flexion.  Goal status: INITIAL  4.  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  Baseline: 06/04/23: Pain with repeated reaching/movement of R arm and with catching students while spotting during dance classes. Goal status: INITIAL   PLAN: PT FREQUENCY: 1-2x/week  PT DURATION: 6 weeks  PLANNED INTERVENTIONS: Therapeutic exercises, Therapeutic activity, Neuromuscular re-education, Balance training, Gait training, Patient/Family education, Self Care, Joint mobilization, Joint manipulation, Vestibular training, Canalith repositioning, Orthotic/Fit training, DME instructions, Dry Needling, Electrical stimulation, Spinal manipulation, Spinal mobilization, Cryotherapy, Moist heat, Taping, Traction, Ultrasound, Ionotophoresis 4mg /ml Dexamethasone, Manual therapy, and Re-evaluation.  PLAN FOR NEXT SESSION: Manual therapy/STM/DTM for R paracervical/periscapular musculature. F/u on use of dry needling and treatment effect. Use of MET and SNAGs to facilitate improved cervical spine AROM. Check passive accessories and ULTT next visit.    Maylon Peppers, PT, DPT Physical Therapist - [place]  [facility]  3:50 PM,06/23/23

## 2023-06-23 ENCOUNTER — Encounter: Payer: Self-pay | Admitting: Physical Therapy

## 2023-06-23 ENCOUNTER — Ambulatory Visit: Payer: 59

## 2023-06-23 DIAGNOSIS — M6281 Muscle weakness (generalized): Secondary | ICD-10-CM

## 2023-06-23 DIAGNOSIS — M25511 Pain in right shoulder: Secondary | ICD-10-CM

## 2023-06-23 DIAGNOSIS — M542 Cervicalgia: Secondary | ICD-10-CM | POA: Diagnosis not present

## 2023-06-24 ENCOUNTER — Encounter: Payer: 59 | Admitting: Physical Therapy

## 2023-06-25 ENCOUNTER — Encounter: Payer: Self-pay | Admitting: Physical Therapy

## 2023-06-30 ENCOUNTER — Encounter: Payer: Self-pay | Admitting: Physical Therapy

## 2023-06-30 ENCOUNTER — Encounter: Payer: 59 | Admitting: Physical Therapy

## 2023-07-01 ENCOUNTER — Encounter: Payer: 59 | Admitting: Physical Therapy

## 2023-07-02 ENCOUNTER — Ambulatory Visit: Payer: 59 | Attending: Nurse Practitioner

## 2023-07-02 ENCOUNTER — Encounter: Payer: Self-pay | Admitting: Physical Therapy

## 2023-07-02 DIAGNOSIS — M6281 Muscle weakness (generalized): Secondary | ICD-10-CM

## 2023-07-02 DIAGNOSIS — M542 Cervicalgia: Secondary | ICD-10-CM | POA: Diagnosis present

## 2023-07-02 DIAGNOSIS — M25511 Pain in right shoulder: Secondary | ICD-10-CM | POA: Diagnosis present

## 2023-07-02 NOTE — Therapy (Signed)
 OUTPATIENT PHYSICAL THERAPY TREATMENT   Patient Name: Barbara Ortega MRN: [id number] DOB:[birth year], 48 y.o., female Today's Date: 07/02/2023  END OF SESSION:  PT End of Session - 07/02/23 1505     Visit Number 6    Number of Visits 13    Date for PT Re-Evaluation 07/16/23    Authorization Type [employer]    Progress Note Due on Visit 10    PT Start Time 1500    PT Stop Time 1540    PT Time Calculation (min) 40 min    Equipment Utilized During Treatment Gait belt    Activity Tolerance Patient tolerated treatment well    Behavior During Therapy WFL for tasks assessed/performed                 Past Medical History:  Diagnosis Date   Alpha-1-antitrypsin deficiency (HCC)    Basal cell carcinoma 04/29/2008   Left medial infraocular   Dysplastic nevus 01/24/2009   Left 3rd distal toe, periungual. Moderate atypia, close to margin   Hypertension    Migraine    Sinus disease    Past Surgical History:  Procedure Laterality Date   BREAST BIOPSY Right 2019   Benign   BREAST BIOPSY Left 2012   benign   COLONOSCOPY WITH PROPOFOL  N/A 01/02/2021   Procedure: COLONOSCOPY WITH PROPOFOL ;  Surgeon: Janalyn Keene NOVAK, MD;  Location: [facility];  Service: Endoscopy;  Laterality: N/A;   NASAL SINUS SURGERY  2011   Dr. 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   Left knee pain 05/09/2021   Tension headache 10/26/2018   Cough 10/26/2018   Hypertriglyceridemia 10/26/2018   Hypertensive disorder 07/15/2018   Contraceptive management  09/21/2014   Alpha-1-antitrypsin deficiency (HCC) 08/22/2014   Migraine with aura 08/22/2014   Fatty infiltration of liver 08/11/2013   Essential hypertension, benign 09/07/2012   Allergic rhinitis 08/05/2012    PCP: Maribeth Camellia MATSU, MD  REFERRING PROVIDER: Gretel App, NP  REFERRING DIAG:  M54.2 (ICD-10-CM) - Neck pain    RATIONALE FOR EVALUATION AND TREATMENT: Rehabilitation  THERAPY DIAG: Muscle weakness (generalized)  Right shoulder pain, unspecified chronicity  Cervicalgia  ONSET DATE: April 2024  FOLLOW-UP APPT SCHEDULED WITH REFERRING PROVIDER: Yes ; f/u with App Gretel in April   Subjective: Increased pain for several days following last viist. HEP going well.    Pertinent History Pt is a 48 year old female referred for neck/upper trap pain with referring diagnosis of trapezius strain. Atraumatic onset for neck pain last Spring. Pt reports it felt like crick in the neck as if she slept wrong. Pt reports starting chiropractic with no relief after 2 months. She reports she stopped this; she then went to PCP and was prescribed prednisone  and Meloxicam  at the time without relief. Pt had recent X-ray following referral from PCP with evidence for cervical spine degenerative changes. Pt is kindergarten and tourist information centre manager. Patient reports difficulty with looking over her R shoulder in particular. Pt reports some intermittent difficulty with swallowing pills/medication.  Patient reports no dysarthria or diplopia. Patient reports no recent vertigo or N&V recently;she did have one episode of dizziness after chiropractic adjustment - this made her no longer want to pursue chiropractic treatment.  Pain:  Pain Intensity: Present: 5-6/10, Best: 5/10, Worst: 7-8/10 Pain location: tight along R upper trap at rest; pt reports radiating to upper arm and also up to R side of jaw; R paracervical region  Pain Quality:  tight   Radiating: Yes ;R upper arm, R side of  jaw Numbness/Tingling: Yes; down R upper limb, upper arm mainly  Focal Weakness: Yes; pt feels dexterity with hands has worsened; difficulty opening jars  Aggravating factors: Attempting to look over shoulder, lateral flexion to R mainly  Relieving factors: Transient relief with Tylenol  arthritis,  24-hour pain behavior: more stiff in AM; pain doesn't change much through day  History of prior neck injury, pain, surgery, or therapy: Yes; hx of chiropractic, Hx of prednisone  taper and Meloxicam  Dominant hand: right Imaging: Yes ;  CLINICAL DATA:  pain   EXAM: CERVICAL SPINE - COMPLETE 5 VIEW   COMPARISON:  None Available.   FINDINGS: No fracture, dislocation or subluxation. No spondylolisthesis. No osteolytic or osteoblastic changes. Prevertebral and cervical cranial soft tissues are unremarkable.   Degenerative disc disease noted with disc space narrowing and marginal osteophytes at C4-C6.   IMPRESSION: Degenerative changes. No acute osseous abnormalities.    Red flags (personal history of cancer, h/o spinal tumors, history of compression fracture, chills/fever, night sweats, nausea, vomiting, unrelenting pain): Night sweats due to menopause   PRECAUTIONS: None  WEIGHT BEARING RESTRICTIONS: No  FALLS: Has patient fallen in last 6 months? No  Living Environment Lives with: lives with their spouse and lives with their son Lives in: House/apartment  Prior level of function: Independent  Occupational demands: Midwife; Dance (jazz, administrator, sports)  Hobbies: Walking regimen with her husband   Patient Goals: Improved pain, neck/shoulder feeling better   OBJECTIVE:   Patient Surveys  FOTO 59/predicted outcome score of 69   Posture Inc thoracic kyphosis, pt rests in moderate cervical protraction, rounded shoulders   AROM AROM (Normal range in degrees) AROM 06/04/23 A/ROM  07/02/23  Cervical   Flexion (50) 40 NT   Extension (80) 47 48  Right lateral flexion (45)  38* 20*  Left lateral flexion (45) 32 (tight) 36 (tight)   Right rotation (85) 55* 62* pain at end range   Left rotation (85) 71 73  (* = pain; Blank rows = not tested)   Shoulder AROM WFL for all planes of motion, no pain reproduced or exacerbated   MMT MMT (out of 5) Right 06/04/23 Left 06/04/23      Shoulder   Flexion 4+ 4+  Extension    Abduction 4+* 4+  Internal rotation 5 5  External rotation  4* 4  Horizontal abduction    Horizontal adduction    Lower Trapezius    Rhomboids        Elbow  Flexion 5 5  Extension 5 5  Pronation    Supination        Wrist  Flexion 5 5  Extension 5 5  Radial deviation    Ulnar deviation        (* = pain; Blank rows = not tested)   Palpation Location LEFT  RIGHT           Suboccipitals 0 0  Cervical paraspinals 0 0  Upper Trapezius 0 1  Levator Scapulae 0  1  Rhomboid Major/Minor 0 1  (Blank rows = not tested) Graded on 0-4 scale (0 = no pain, 1 = pain, 2 = pain with wincing/grimacing/flinching, 3 = pain with withdrawal, 4 = unwilling to allow palpation), (Blank rows = not tested)  Repeated Movements Repeated cervical retraction: no effect during, no effect after    Passive Accessory Intervertebral Motion      SPECIAL TESTS Spurlings A (ipsilateral lateral flexion/axial compression): R: Positive L: Negative Distraction Test: Negative  Hoffman Sign (cervical cord compression): R: Negative L: Negative  Updated 06/09/23 ULTT Median: R: Negative  L: Negative ULTT Ulnar: R: Negative L: Negative ULTT Radial: R: Negative L: Negative   Thoracic ROM 07/02/23:  -seated extension (moderately restricted) -seated flexion WNL -seated Rt rotation: ~65 degreees -seated Lt rotation: ~65 degrees    TODAY'S TREATMENT 07/02/23 -reassessed ROM cervical spine, added in thoracic spine (similar to eval 4 weeks prior)  -Rt cervical lateral pillar myofasical stretch (not responsive)  -cervical manual traction 3x30sec  -t5 towel  roll with BUE wand flexion x20 -book openings x20 bilat -BTB row x15 -standing against wall BUE overhead flexion x15 (tactile input for scapula depression, thoracic extension)  -standing shoulder extension BTB x15 *asked to take a hiatus from any HEP items that trigger end-range pain   PATIENT EDUCATION:  Education details: Reassessment indicates facet mediated pain, supported by imaging: not clear that ROM will improve dramatically going forward, but interventions can still hekp with reactive muscle spasm.  Person educated: Patient Education method: Explanation, Demonstration, and Handouts Education comprehension: verbalized understanding and returned demonstration   HOME EXERCISE PROGRAM:  Access Code: [facility] URL: [email or web address] Date: 06/04/2023 Prepared by: Venetia Endo   Exercises - Seated Cervical Retraction  - 3 x daily - 7 x weekly - 2 sets - 10 reps - 1sec hold - Seated Upper Trapezius Stretch  - 2 x daily - 7 x weekly - 3 sets - 30sec hold - Seated Levator Scapulae Stretch  - 2 x daily - 7 x weekly - 3 sets - 30sec hold  Access Code: [facility] URL: [email or web address] Date: 06/16/2023 Prepared by: Ronita Oris  Exercises - Supine Posterior Scalene Stretch  - 1 x daily - 7 x weekly - 3 sets - 10 reps - Supine Anterior Scalene Stretch  - 1 x daily - 7 x weekly - 3 sets - 10 reps - Seated Scapular Retraction  - 1 x daily - 7 x weekly - 3 sets - 10 reps - Standing Shoulder Shrugs with Dumbbells  - 1 x daily - 7 x weekly - 3 sets - 10 reps  Access Code: [email or web address] URL: [email or web address] Date: 06/23/2023 Prepared by: Maryanne Finder  Exercises - Standing Shoulder Row with Anchored Resistance  - 2-3 x daily - 5-7 x weekly - 3 sets - 10 reps - Shoulder extension with resistance - Neutral  - 2-3 x daily - 5-7 x weekly - 3 sets - 10 reps - Standing Shoulder Horizontal Abduction with Resistance  - 2-3 x daily - 5-7 x  weekly - 3 sets - 10 reps   ASSESSMENT:  CLINICAL IMPRESSION:   Reassessment indicates facet mediated pain, supported by imaging: not clear that ROM will improve dramatically going forward, but interventions can still hekp with reactive muscle spasm. ROM about the same, some improvement on CL side. Thoracic extension and rotation are mildly limited, henceimproving these should improve regional dependence for funcitonal movements. Patient will continue to benefit from skilled therapy to address remaining deficits  in order to improve quality of life and return to PLOF.     OBJECTIVE IMPAIRMENTS: decreased ROM, hypomobility, impaired flexibility, impaired UE functional use, and postural dysfunction.   ACTIVITY LIMITATIONS: lifting, sitting, standing, transfers, and scanning environment  PARTICIPATION LIMITATIONS: driving, community activity, and occupation  PERSONAL FACTORS: Past/current experiences, Time since onset of injury/illness/exacerbation, and 2 comorbidities (HTN and migraine history) are also affecting patient's functional outcome.   REHAB POTENTIAL: Good  CLINICAL DECISION MAKING: Evolving/moderate complexity  EVALUATION COMPLEXITY: Moderate   GOALS: Goals reviewed with patient? Yes  SHORT TERM GOALS: Target date: 06/27/2023  Pt will be independent with HEP to improve strength and decrease neck pain to improve pain-free function at home and work. Baseline: 06/04/23: Baseline HEP initiated, MedBridge handout provided  Goal status: INITIAL   LONG TERM GOALS: Target date: 07/11/2023  Pt will increase FOTO to at least 69 to demonstrate significant improvement in function at home and work related to neck pain  Baseline: 06/04/23: 59 Goal status: INITIAL  2.  Pt will decrease worst neck pain by at least 2 points on the NPRS in order to demonstrate clinically significant reduction in neck pain. Baseline: 06/04/23: 7-8/10 at worst Goal status: INITIAL  3.  Pt will demonstrate  cervical spine AROM to 60 deg or greater for bilat rotation without reproduction of pain and no pain with lateral flexion in either direction as needed for scanning environment and coupled C-spine movement of sidebend/rotation during AROM.   Baseline: 06/04/23: Motion loss and pain with R rotation, pain with R lateral flexion.  Goal status: INITIAL  4.  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  Baseline: 06/04/23: Pain with repeated reaching/movement of R arm and with catching students while spotting during dance classes. Goal status: INITIAL   PLAN: PT FREQUENCY: 1-2x/week  PT DURATION: 6 weeks  PLANNED INTERVENTIONS: Therapeutic exercises, Therapeutic activity, Neuromuscular re-education, Balance training, Gait training, Patient/Family education, Self Care, Joint mobilization, Joint manipulation, Vestibular training, Canalith repositioning, Orthotic/Fit training, DME instructions, Dry Needling, Electrical stimulation, Spinal manipulation, Spinal mobilization, Cryotherapy, Moist heat, Taping, Traction, Ultrasound, Ionotophoresis 4mg /ml Dexamethasone , Manual therapy, and Re-evaluation.  PLAN FOR NEXT SESSION: Manual therapy/STM/DTM for R paracervical/periscapular musculature. F/u on use of dry needling and treatment effect. Use of MET and SNAGs to facilitate improved cervical spine AROM. Check passive accessories and ULTT next visit.   3:46 PM, 07/02/23 Peggye JAYSON Linear, PT, DPT Physical Therapist - [facility] in [place]  [phone] (Office)     3:08 PM,07/02/23

## 2023-07-03 ENCOUNTER — Encounter: Payer: 59 | Admitting: Physical Therapy

## 2023-07-07 ENCOUNTER — Ambulatory Visit: Payer: 59 | Admitting: Physical Therapy

## 2023-07-07 ENCOUNTER — Encounter: Payer: Self-pay | Admitting: Physical Therapy

## 2023-07-08 ENCOUNTER — Encounter: Payer: 59 | Admitting: Physical Therapy

## 2023-07-09 ENCOUNTER — Encounter: Payer: 59 | Admitting: Physical Therapy

## 2023-07-09 ENCOUNTER — Encounter: Payer: Self-pay | Admitting: Physical Therapy

## 2023-07-10 ENCOUNTER — Encounter: Payer: 59 | Admitting: Physical Therapy

## 2023-07-14 ENCOUNTER — Encounter: Payer: 59 | Admitting: Physical Therapy

## 2023-07-15 ENCOUNTER — Encounter: Payer: 59 | Admitting: Physical Therapy

## 2023-07-16 ENCOUNTER — Encounter: Payer: 59 | Admitting: Physical Therapy

## 2023-07-17 ENCOUNTER — Encounter: Payer: 59 | Admitting: Physical Therapy

## 2023-07-21 ENCOUNTER — Encounter: Payer: 59 | Admitting: Physical Therapy

## 2023-07-22 ENCOUNTER — Encounter: Payer: 59 | Admitting: Physical Therapy

## 2023-07-23 ENCOUNTER — Encounter: Payer: 59 | Admitting: Physical Therapy

## 2023-07-24 ENCOUNTER — Encounter: Payer: 59 | Admitting: Physical Therapy

## 2023-09-10 ENCOUNTER — Ambulatory Visit: Admitting: Nurse Practitioner

## 2023-09-16 ENCOUNTER — Encounter: Payer: BC Managed Care – PPO | Admitting: Nurse Practitioner

## 2023-10-06 ENCOUNTER — Encounter: Payer: Self-pay | Admitting: Nurse Practitioner

## 2023-10-06 ENCOUNTER — Ambulatory Visit: Admitting: Nurse Practitioner

## 2023-10-06 VITALS — BP 118/80 | HR 73 | Temp 98.7°F | Ht 63.0 in | Wt 181.8 lb

## 2023-10-06 DIAGNOSIS — R6889 Other general symptoms and signs: Secondary | ICD-10-CM

## 2023-10-06 DIAGNOSIS — J069 Acute upper respiratory infection, unspecified: Secondary | ICD-10-CM | POA: Diagnosis not present

## 2023-10-06 DIAGNOSIS — Z1152 Encounter for screening for COVID-19: Secondary | ICD-10-CM | POA: Diagnosis not present

## 2023-10-06 LAB — POCT INFLUENZA A/B
Influenza A, POC: NEGATIVE
Influenza B, POC: NEGATIVE

## 2023-10-06 LAB — POC COVID19 BINAXNOW: SARS Coronavirus 2 Ag: NEGATIVE

## 2023-10-06 MED ORDER — AZITHROMYCIN 250 MG PO TABS: ORAL_TABLET | ORAL | 0 refills | Status: AC

## 2023-10-06 MED ORDER — DEXAMETHASONE SODIUM PHOSPHATE 10 MG/ML IJ SOLN
8.0000 mg | Freq: Once | INTRAMUSCULAR | Status: AC
  Administered 2023-10-06: 8 mg via INTRAMUSCULAR

## 2023-10-06 NOTE — Progress Notes (Signed)
 Provider gave verbal orders for 8 mg of Dexamethasone. 0.8 mL was drawn from a 10 mg/mL vial with a waste of 0.2 mL. Pt tolerated injection well int he left anterior thigh.

## 2023-10-06 NOTE — Progress Notes (Signed)
 Barbara Burkitt, NP-C Phone: [phone]  Barbara Ortega is a 48 y.o. female who presents today for congestion.   Discussed the use of AI scribe software for clinical note transcription with the patient, who gave verbal consent to proceed.  History of Present Illness   Barbara Ortega "Barbara Ortega" is a 48 year old female who presents with headache, sore throat, chest congestion, and ear pain.  She has been experiencing a headache, sore throat, chest congestion, and left ear pain. These symptoms began on Saturday with a scratchy throat and drainage, progressing to a low-grade fever and exhaustion by Sunday. She is eager to recover quickly due to her daughter's upcoming wedding on Saturday.  Her cough is associated with congestion in both her chest and head, accompanied by drainage and a sore throat. She experiences mild shortness of breath and fatigue. No nausea or vomiting, but she has a decreased appetite. She had a low-grade fever yesterday and continues to feel tired.  She has been exposed to a large number of children recently, as she teaches dance part-time and kindergarten, with several children being absent due to illness. She has been taking Coricidin for cold and flu symptoms.      Social History   Tobacco Use  Smoking Status Never  Smokeless Tobacco Never    Current Outpatient Medications on File Prior to Visit  Medication Sig Dispense Refill   azelastine  (ASTELIN ) 0.1 % nasal spray Place 2 sprays into both nostrils 2 (two) times daily. Use in each nostril as directed 30 mL 12   cetirizine (ZYRTEC) 10 MG tablet Take 10 mg by mouth daily.     cyclobenzaprine  (FLEXERIL ) 5 MG tablet Take 1 tablet (5 mg total) by mouth 3 (three) times daily as needed for muscle spasms. 30 tablet 0   gabapentin  (NEURONTIN ) 300 MG capsule TAKE 1 CAPSULE(300 MG) BY MOUTH AT BEDTIME 90 capsule 3   levonorgestrel (MIRENA) 20 MCG/24HR IUD 1 each by Intrauterine route once.     meloxicam  (MOBIC ) 15 MG  tablet TAKE 1 TABLET(15 MG) BY MOUTH DAILY 30 tablet 0   Multiple Vitamin (MULTIVITAMIN) tablet Take 1 tablet by mouth daily.     valsartan -hydrochlorothiazide  (DIOVAN -HCT) 160-12.5 MG tablet Take 1 tablet by mouth daily. 90 tablet 3   No current facility-administered medications on file prior to visit.     ROS see history of present illness  Objective  Physical Exam Vitals:   10/06/23 0824  BP: 118/80  Pulse: 73  Temp: 98.7 F (37.1 C)  SpO2: 95%    BP Readings from Last 3 Encounters:  10/06/23 118/80  04/21/23 126/80  03/14/23 126/78   Wt Readings from Last 3 Encounters:  10/06/23 181 lb 12.8 oz (82.5 kg)  04/21/23 186 lb 12.8 oz (84.7 kg)  03/14/23 187 lb 6.4 oz (85 kg)    Physical Exam Constitutional:      General: She is not in acute distress.    Appearance: Normal appearance.  HENT:     Head: Normocephalic.     Right Ear: Tympanic membrane normal.     Left Ear: Tympanic membrane normal.     Nose: Congestion present.     Mouth/Throat:     Mouth: Mucous membranes are moist.     Pharynx: Oropharynx is clear.  Eyes:     Conjunctiva/sclera: Conjunctivae normal.     Pupils: Pupils are equal, round, and reactive to light.  Cardiovascular:     Rate and Rhythm: Normal rate and regular rhythm.  Heart sounds: Normal heart sounds.  Pulmonary:     Effort: Pulmonary effort is normal.     Breath sounds: Normal breath sounds. No wheezing.  Lymphadenopathy:     Cervical: No cervical adenopathy.  Skin:    General: Skin is warm and dry.  Neurological:     General: No focal deficit present.     Mental Status: She is alert.  Psychiatric:        Mood and Affect: Mood normal.        Behavior: Behavior normal.      Assessment/Plan: Please see individual problem list.  Upper respiratory tract infection, unspecified type Assessment & Plan: She presents with an acute upper respiratory infection characterized by headache, sore throat, chest congestion, left ear  pain, cough, and low-grade fever. COVID-19 and influenza swabs are negative. Likely viral in nature at this time. Informed consent for a steroid injection was obtained. Administer Dexamethasone 8 mg IM in office. Counseled on common side effects. Prescribe antibiotics if there is no improvement by Wednesday. Continue over-the-counter cold and flu medication as tolerated. Encourage hydration and continuation of allergy medication. Return precautions given to patient.   Orders: -     dexAMETHasone Sodium Phosphate -     Azithromycin ; Take 2 tablets on day 1, then 1 tablet daily on days 2 through 5  Dispense: 6 tablet; Refill: 0  Flu-like symptoms -     POCT Influenza A/B  Encounter for screening for COVID-19 -     POC COVID-19 BinaxNow      Return if symptoms worsen or fail to improve.   Barbara Burkitt, NP-C [facility]

## 2023-10-06 NOTE — Assessment & Plan Note (Signed)
 She presents with an acute upper respiratory infection characterized by headache, sore throat, chest congestion, left ear pain, cough, and low-grade fever. COVID-19 and influenza swabs are negative. Likely viral in nature at this time. Informed consent for a steroid injection was obtained. Administer Dexamethasone 8 mg IM in office. Counseled on common side effects. Prescribe antibiotics if there is no improvement by Wednesday. Continue over-the-counter cold and flu medication as tolerated. Encourage hydration and continuation of allergy medication. Return precautions given to patient.

## 2023-11-07 ENCOUNTER — Encounter: Admitting: Nurse Practitioner

## 2023-11-11 ENCOUNTER — Telehealth: Payer: Self-pay | Admitting: Nurse Practitioner

## 2023-12-06 ENCOUNTER — Encounter: Payer: Self-pay | Admitting: Nurse Practitioner

## 2023-12-08 ENCOUNTER — Other Ambulatory Visit: Payer: Self-pay

## 2023-12-08 DIAGNOSIS — I10 Essential (primary) hypertension: Secondary | ICD-10-CM

## 2023-12-08 MED ORDER — VALSARTAN-HYDROCHLOROTHIAZIDE 160-12.5 MG PO TABS: 1.0000 | ORAL_TABLET | Freq: Every day | ORAL | 0 refills | Status: DC

## 2023-12-12 ENCOUNTER — Encounter: Admitting: Nurse Practitioner

## 2023-12-19 ENCOUNTER — Encounter: Payer: Self-pay | Admitting: Plastic Surgery

## 2023-12-19 ENCOUNTER — Ambulatory Visit: Admitting: Plastic Surgery

## 2023-12-19 VITALS — BP 143/87 | HR 73 | Ht 63.0 in | Wt 177.4 lb

## 2023-12-19 DIAGNOSIS — Z6831 Body mass index (BMI) 31.0-31.9, adult: Secondary | ICD-10-CM

## 2023-12-19 DIAGNOSIS — Z803 Family history of malignant neoplasm of breast: Secondary | ICD-10-CM

## 2023-12-19 DIAGNOSIS — M62838 Other muscle spasm: Secondary | ICD-10-CM

## 2023-12-19 DIAGNOSIS — M546 Pain in thoracic spine: Secondary | ICD-10-CM | POA: Diagnosis not present

## 2023-12-19 DIAGNOSIS — M549 Dorsalgia, unspecified: Secondary | ICD-10-CM | POA: Insufficient documentation

## 2023-12-19 DIAGNOSIS — M542 Cervicalgia: Secondary | ICD-10-CM | POA: Diagnosis not present

## 2023-12-19 DIAGNOSIS — G8929 Other chronic pain: Secondary | ICD-10-CM

## 2023-12-19 DIAGNOSIS — S46811A Strain of other muscles, fascia and tendons at shoulder and upper arm level, right arm, initial encounter: Secondary | ICD-10-CM

## 2023-12-19 DIAGNOSIS — N62 Hypertrophy of breast: Secondary | ICD-10-CM

## 2023-12-19 DIAGNOSIS — D242 Benign neoplasm of left breast: Secondary | ICD-10-CM

## 2023-12-19 DIAGNOSIS — G43109 Migraine with aura, not intractable, without status migrainosus: Secondary | ICD-10-CM

## 2023-12-19 NOTE — Progress Notes (Addendum)
 Patient ID: Barbara Ortega, female    DOB: 03/04/1976, 47 y.o.   MRN: [id number]   Chief Complaint  Patient presents with   Advice Only    Mammary Hyperplasia: The patient is a 48 y.o. female with a history of mammary hyperplasia for several years.  She has extremely large breasts causing symptoms that include the following: Back pain in the upper and lower back, including neck pain. She pulls or pins her bra straps to provide better lift and relief of the pressure and pain. She notices relief by holding her breast up manually.  Her shoulder straps cause grooves and pain and pressure that requires padding for relief. Pain medication is sometimes required with motrin  and tylenol .  Activities that are hindered by enlarged breasts include: exercise and running.  She has tried supportive clothing as well as fitted bras without improvement.  Her breasts are extremely large and fairly symmetric.  She has hyperpigmentation of the inframammary area on both sides.  The sternal to nipple distance on the right is 35 cm and the left is 36 cm.  The IMF distance is 14 cm.  She is 5 feet 3 inches tall and weighs 177 pounds.  The BMI = 31.4 kg/m.  Preoperative bra size = 38DDD cup. She would like to be a C cup. The estimated excess breast tissue to be removed at the time of surgery = 785 grams on the left and 785 grams on the right.  Mammogram history: sep 2024 will be due for MM in Sep.  Family history of breast cancer:  maternal aunt and mother.  Tobacco use:  none.   The patient expresses the desire to pursue surgical intervention.  The patient already had physical therapy with mild improvement nothing significant.     Review of Systems  Constitutional:  Positive for activity change.  HENT: Negative.    Eyes: Negative.   Respiratory: Negative.    Cardiovascular: Negative.   Gastrointestinal: Negative.   Genitourinary: Negative.   Musculoskeletal:  Positive for back pain and neck pain.  Skin:   Positive for rash.    Past Medical History:  Diagnosis Date   Alpha-1-antitrypsin deficiency (HCC)    Basal cell carcinoma 04/29/2008   Left medial infraocular   Dysplastic nevus 01/24/2009   Left 3rd distal toe, periungual. Moderate atypia, close to margin   Hypertension    Migraine    Sinus disease     Past Surgical History:  Procedure Laterality Date   BREAST BIOPSY Right 2019   Benign   BREAST BIOPSY Left 2012   benign   COLONOSCOPY WITH PROPOFOL  N/A 01/02/2021   Procedure: COLONOSCOPY WITH PROPOFOL ;  Surgeon: Janalyn Keene NOVAK, MD;  Location: [facility];  Service: Endoscopy;  Laterality: N/A;   NASAL SINUS SURGERY  2011   Dr. Edda   POLYPECTOMY  01/02/2021   Procedure: POLYPECTOMY;  Surgeon: Janalyn Keene NOVAK, MD;  Location: [facility];  Service: Endoscopy;;   VAGINAL DELIVERY     1   WISDOM TOOTH EXTRACTION        Current Outpatient Medications:    azelastine  (ASTELIN ) 0.1 % nasal spray, Place 2 sprays into both nostrils 2 (two) times daily. Use in each nostril as directed, Disp: 30 mL, Rfl: 12   cetirizine (ZYRTEC) 10 MG tablet, Take 10 mg by mouth daily., Disp: , Rfl:    cyclobenzaprine  (FLEXERIL ) 5 MG tablet, Take 1 tablet (5 mg total) by mouth 3 (three) times  daily as needed for muscle spasms., Disp: 30 tablet, Rfl: 0   gabapentin  (NEURONTIN ) 300 MG capsule, TAKE 1 CAPSULE(300 MG) BY MOUTH AT BEDTIME, Disp: 90 capsule, Rfl: 3   levonorgestrel (MIRENA) 20 MCG/24HR IUD, 1 each by Intrauterine route once., Disp: , Rfl:    meloxicam  (MOBIC ) 15 MG tablet, TAKE 1 TABLET(15 MG) BY MOUTH DAILY, Disp: 30 tablet, Rfl: 0   Multiple Vitamin (MULTIVITAMIN) tablet, Take 1 tablet by mouth daily., Disp: , Rfl:    valsartan -hydrochlorothiazide  (DIOVAN -HCT) 160-12.5 MG tablet, Take 1 tablet by mouth daily., Disp: 90 tablet, Rfl: 0   Objective:   Vitals:   12/19/23 1258  BP: (!) 143/87  Pulse: 73  SpO2: 98%    Physical Exam Vitals reviewed.   Constitutional:      Appearance: Normal appearance.  HENT:     Head: Atraumatic.  Cardiovascular:     Rate and Rhythm: Normal rate.     Pulses: Normal pulses.  Pulmonary:     Effort: Pulmonary effort is normal.  Abdominal:     General: There is no distension.     Palpations: Abdomen is soft.  Skin:    General: Skin is warm.     Capillary Refill: Capillary refill takes less than 2 seconds.  Neurological:     Mental Status: She is alert and oriented to person, place, and time.  Psychiatric:        Mood and Affect: Mood normal.        Behavior: Behavior normal.        Thought Content: Thought content normal.        Judgment: Judgment normal.     Assessment & Plan:  Migraine with aura and without status migrainosus, not intractable  Trapezius muscle strain, right, initial encounter  Fibroadenoma of left breast  Neck pain  Muscle spasm  Symptomatic mammary hypertrophy  Chronic bilateral thoracic back pain  The procedure the patient selected and that was best for the patient was discussed. The risk were discussed and include but not limited to the following:  Breast asymmetry, fluid accumulation, firmness of the breast, inability to breast feed, loss of nipple or areola, skin loss, change in skin and nipple sensation, fat necrosis of the breast tissue, bleeding, infection and healing delay.  There are risks of anesthesia and injury to nerves or blood vessels.  Allergic reaction to tape, suture and skin glue are possible.  There will be swelling.  Any of these can lead to the need for revisional surgery which is not included in this surgery.  A breast reduction has potential to interfere with diagnostic procedures in the future.  This procedure is best done when the breast is fully developed.  Changes in the breast will continue to occur over time: pregnancy, weight gain or weigh loss. No guarantees are given for a certain bra or breast size.    Total time: 40 minutes. This  includes time spent with the patient during the visit as well as time spent before and after the visit reviewing the chart, documenting the encounter, ordering pertinent studies and literature for the patient.   Physical therapy:  done Mammogram:  due in Sep 2025  I think the patient would be a good candidate for bilateral breast reduction with lateral liposuction.  Pictures were obtained of the patient and placed in the chart with the patient's or guardian's permission.   Estefana RAMAN Alainna Stawicki, DO

## 2024-01-13 NOTE — Telephone Encounter (Signed)
 error

## 2024-02-06 ENCOUNTER — Other Ambulatory Visit: Payer: Self-pay | Admitting: Nurse Practitioner

## 2024-02-06 DIAGNOSIS — Z1231 Encounter for screening mammogram for malignant neoplasm of breast: Secondary | ICD-10-CM

## 2024-02-12 ENCOUNTER — Ambulatory Visit: Payer: Self-pay

## 2024-02-12 NOTE — Telephone Encounter (Signed)
 Copied from CRM [id number]. Topic: Clinical - Medical Advice >> Feb 12, 2024  1:05 PM [name] wrote: Reason for CRM: Patient just had to reschedule her [facility] appointment into January, but she is currently experiencing hot flashes that have been going on for a year, but worsening in the past 3 months. Patient questioning if anything can get prescribed until she gets seen by Leron Glance.

## 2024-02-12 NOTE — Telephone Encounter (Signed)
 FYI Only or Action Required?: Action required by provider: clinical question for provider.  Patient was last seen in primary care on 10/06/2023 by Barbara App, NP.  Called Nurse Triage reporting Hot Flashes.  Symptoms began about a year ago.  Interventions attempted: Prescription medications: Gabapentin .  Symptoms are: gradually worsening.  Triage Disposition: Home Care, No Contact Calls  Patient/caregiver understands and will follow disposition?: Yes    Copied from CRM [id number]. Topic: Clinical - Medical Advice >> Feb 12, 2024  1:05 PM Barbara Ortega wrote: Reason for CRM: Patient just had to reschedule her [facility] appointment into January, but she is currently experiencing hot flashes that have been going on for a year, but worsening in the past 3 months. Patient questioning if anything can get prescribed until she gets seen by [name].      Reason for Disposition  Normal menopause symptoms (Ortega.g., hot flashes, irregular periods, vaginal dryness)  Answer Assessment - Initial Assessment Questions Patient states that Dr. Maribeth prescribed her Gabapentin  for her hot flashes but states it is not working. Patient would like to know if something else could be prescribed for her to help until her [facility] appointment.    1. MAIN SYMPTOM: What is your main symptom? How bad is it?  (Ortega.g., difficulty concentrating, hot flashes, fatigue, irregular periods, vaginal dryness)     Hot flashes  2. ONSET: When did the hot flashes begin? (Ortega.g., hours, days or weeks ago)     A year ago, worse  over the last 3 months  3. MENOPAUSE: When was your last menstrual period?     Patient has Mirena and has not had one in a long time  4. MEDICINES: Are you taking any hormone prescription or over-the-counter medicines? (Ortega.g., estrogen; estrogen/progesterone, herbal supplements, medicines for mood, new medicines, Niacin)     No 5. OTHER SYMPTOMS: Do you have any other symptoms? (Ortega.g., insomnia,  mood swings, palpitations)     Irritability, brain fog, difficulty sleeping, joint pain  7. TREATMENTS AND RESPONSE: What have you done so far to try to make this better?     Gabapentin  which is not working  Protocols used: Menopause Symptoms and Questions-A-AH

## 2024-02-12 NOTE — Telephone Encounter (Signed)
 Second attempt: LVM for patient to return call to [phone]. Routed to [facility] for follow up   Copied from CRM #[id number]. Topic: Clinical - Medical Advice >> Feb 12, 2024  1:05 PM [name] wrote: Reason for CRM: Patient just had to reschedule her [facility] appointment into January, but she is currently experiencing hot flashes that have been going on for a year, but worsening in the past 3 months. Patient questioning if anything can get prescribed until she gets seen by Leron Glance.

## 2024-02-13 ENCOUNTER — Telehealth: Payer: Self-pay | Admitting: Nurse Practitioner

## 2024-02-13 NOTE — Telephone Encounter (Signed)
 Wait list has been lifted from possible appt for pt as appt was needed. There was 2 attempts to get pt scheduled,    E2C2 PLEASE HAVE PT SCHEDULED AN ACUTE VISIT AS SHE WILL NEED AN APPT FOR MEDICATION TO BE SENT IN

## 2024-02-13 NOTE — Telephone Encounter (Signed)
 Copied from CRM #[id number]. Topic: Appointments - Scheduling Inquiry for Clinic >> Feb 13, 2024  4:41 PM Barbara Ortega wrote: Reason for CRM:  Acute visit - Hot flashes. Ok any provider per chart note.  Patient declined scheduling, states she can only come in after 3:30p and declined scheduling video visit before then also. Patient wants to know if she can be worked in. # [phone]

## 2024-02-13 NOTE — Telephone Encounter (Signed)
 VM left to CB was trying to get pt scheduled sent a mychart msg as well

## 2024-02-13 NOTE — Telephone Encounter (Signed)
 E2c2 please transfer call to office

## 2024-02-23 ENCOUNTER — Ambulatory Visit
Admission: RE | Admit: 2024-02-23 | Discharge: 2024-02-23 | Disposition: A | Discharge status: Home / Self Care | Source: Ambulatory Visit

## 2024-02-23 DIAGNOSIS — Z1231 Encounter for screening mammogram for malignant neoplasm of breast: Secondary | ICD-10-CM

## 2024-02-26 ENCOUNTER — Other Ambulatory Visit: Payer: Self-pay | Admitting: Nurse Practitioner

## 2024-02-26 DIAGNOSIS — R928 Other abnormal and inconclusive findings on diagnostic imaging of breast: Secondary | ICD-10-CM

## 2024-03-03 ENCOUNTER — Ambulatory Visit

## 2024-03-03 ENCOUNTER — Ambulatory Visit
Admission: RE | Admit: 2024-03-03 | Discharge: 2024-03-03 | Disposition: A | Discharge status: Home / Self Care | Source: Ambulatory Visit | Attending: Nurse Practitioner | Admitting: Nurse Practitioner

## 2024-03-03 ENCOUNTER — Encounter: Admitting: Nurse Practitioner

## 2024-03-03 DIAGNOSIS — R928 Other abnormal and inconclusive findings on diagnostic imaging of breast: Secondary | ICD-10-CM

## 2024-03-05 ENCOUNTER — Other Ambulatory Visit: Payer: Self-pay | Admitting: Nurse Practitioner

## 2024-03-05 DIAGNOSIS — I10 Essential (primary) hypertension: Secondary | ICD-10-CM

## 2024-04-29 NOTE — H&P (View-Only) (Signed)
 Patient ID: Barbara Ortega, female    DOB: Jan 13, 1976, 48 y.o.   MRN: [id number]  Chief Complaint  Patient presents with   Pre-op Exam      ICD-10-CM   1. Symptomatic mammary hypertrophy  N62        History of Present Illness: Barbara Ortega is a 48 y.o.  female  with a history of macromastia.  She presents for preoperative evaluation for upcoming procedure, Bilateral Breast Reduction with liposuction, scheduled for 05/26/2024 with Dr.  Lowery  The patient reports that the first time she had anesthesia, she became nauseous afterwards.  She states that when she had a colonoscopy recently, she did not have any issues with anesthesia.  Patient reports that her mammogram back in October was normal.  She reports that her aunt and her mother had breast cancer.  She denies any personal history of breast cancer.  She denies any history of cardiac disease.  She denies taking any blood thinners.  Patient reports she is not a smoker.  Patient reports she currently has a Mirena IUD in place.  Patient also states that she is on an estrogen patch for hormone replacement for menopause.  She denies any history of greater than 3 miscarriages.  She denies any personal family history of blood clots or clotting diseases.  She denies any recent surgeries, traumas or infections.  She denies any history of stroke or heart attack.  She denies any history of Crohn's disease or ulcerative colitis, COPD or asthma.  She denies any history of cancer.  She denies any varicosities to her lower extremities.  She denies any recent fevers, chills or changes in her health.  Patient reports she is currently a 38 DDD cup, and states that she would like to be a full B cup.  I discussed with the patient that cup size cannot be guaranteed.  Patient expressed understanding.  Patient does report that around 10 years ago, she was diagnosed with alpha 1 antitrypsin deficiency.  She states that this was tested because she  had elevated liver enzymes.  She states that she did see a hepatologist at the time, but has not followed up with them in several years.  She states that she had a biopsy of her liver done at the time of her diagnosis and it did not show anything concerning per the patient.  She states that she has not had any issues since she was diagnosed with the alpha 1 antitrypsin deficiency.  She denies any pulmonary issues as well.  Summary of Previous Visit: Patient was seen for initial consult by Dr. Lowery on 12/19/2023.  At this visit, patient appointment of her back and neck pain due to her enlarged breast.  On exam, her STN on the right was 35 cm and her STN on the left was 36 cm.  Her BMI was 31.4 kg/m.  Her preoperative bra size was a DDD cup, patient reported she wanted to be a C cup.  It was estimated that 785 g could be removed from each breast at the time of surgery.  The patient was found to be a good candidate for bilateral breast reduction with lateral liposuction.  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  Understands that she will have restrictions for 6 weeks postoperatively.  PMH Significant for: Hypertension, alpha 1 antitrypsin deficiency, symptomatic mammary hypertrophy  Past Medical History: Allergies: Allergies  Allergen Reactions   Elemental Sulfur Hives    Current Medications:  Current Outpatient Medications:    azelastine  (ASTELIN ) 0.1 % nasal spray, Place 2 sprays into both nostrils 2 (two) times daily. Use in each nostril as directed, Disp: 30 mL, Rfl: 12   cephALEXin  (KEFLEX ) 500 MG capsule, Take 1 capsule (500 mg total) by mouth 4 (four) times daily for 3 days., Disp: 12 capsule, Rfl: 0   cetirizine (ZYRTEC) 10 MG tablet, Take 10 mg by mouth daily., Disp: , Rfl:    levonorgestrel (MIRENA) 20 MCG/24HR IUD, 1 each by Intrauterine route once., Disp: , Rfl:    Multiple Vitamin  (MULTIVITAMIN) tablet, Take 1 tablet by mouth daily., Disp: , Rfl:    ondansetron  (ZOFRAN ) 4 MG tablet, Take 1 tablet (4 mg total) by mouth every 8 (eight) hours as needed for up to 10 doses for nausea or vomiting., Disp: 10 tablet, Rfl: 0   traMADol  (ULTRAM ) 50 MG tablet, Take 1 tablet (50 mg total) by mouth every 8 (eight) hours as needed for up to 15 doses for moderate pain (pain score 4-6) or severe pain (pain score 7-10)., Disp: 15 tablet, Rfl: 0   valsartan -hydrochlorothiazide  (DIOVAN -HCT) 160-12.5 MG tablet, TAKE 1 TABLET BY MOUTH DAILY, Disp: 90 tablet, Rfl: 1   cyclobenzaprine  (FLEXERIL ) 5 MG tablet, Take 1 tablet (5 mg total) by mouth 3 (three) times daily as needed for muscle spasms., Disp: 30 tablet, Rfl: 0   gabapentin  (NEURONTIN ) 300 MG capsule, TAKE 1 CAPSULE(300 MG) BY MOUTH AT BEDTIME, Disp: 90 capsule, Rfl: 3   meloxicam  (MOBIC ) 15 MG tablet, TAKE 1 TABLET(15 MG) BY MOUTH DAILY, Disp: 30 tablet, Rfl: 0  Past Medical Problems: Past Medical History:  Diagnosis Date   Alpha-1-antitrypsin deficiency (HCC)    Basal cell carcinoma 04/29/2008   Left medial infraocular   Dysplastic nevus 01/24/2009   Left 3rd distal toe, periungual. Moderate atypia, close to margin   Hypertension    Migraine    Sinus disease     Past Surgical History: Past Surgical History:  Procedure Laterality Date   BREAST BIOPSY Right 2019   Benign   BREAST BIOPSY Left 2012   benign   COLONOSCOPY WITH PROPOFOL  N/A 01/02/2021   Procedure: COLONOSCOPY WITH PROPOFOL ;  Surgeon: Janalyn Keene NOVAK, MD;  Location: [facility];  Service: Endoscopy;  Laterality: N/A;   NASAL SINUS SURGERY  2011   Dr. Edda   POLYPECTOMY  01/02/2021   Procedure: POLYPECTOMY;  Surgeon: Janalyn Keene NOVAK, MD;  Location: [facility];  Service: Endoscopy;;   VAGINAL DELIVERY     1   WISDOM TOOTH EXTRACTION      Social History: Social History   Socioeconomic History   Marital status: Married    Spouse  name: Not on file   Number of children: Not on file   Years of education: Not on file   Highest education level: Master's degree (e.g., MA, MS, MEng, MEd, MSW, MBA)  Occupational History   Not on file  Tobacco Use   Smoking status: Never   Smokeless tobacco: Never  Vaping Use   Vaping status: Never Used  Substance and Sexual Activity   Alcohol use: No    Alcohol/week: 0.0 standard drinks of alcohol   Drug use: No   Sexual activity: Not on file  Other Topics Concern   Not on file  Social History Narrative   Lives with son in [place].  Work - Furniture Conservator/restorer, Geographical Information Systems Officer      Diet - regular      Exercise- occasional   Social Drivers of Corporate Investment Banker Strain: Patient Declined (03/14/2023)   Overall Financial Resource Strain (CARDIA)    Difficulty of Paying Living Expenses: Patient declined  Food Insecurity: Patient Declined (03/14/2023)   Hunger Vital Sign    Worried About Running Out of Food in the Last Year: Patient declined    Ran Out of Food in the Last Year: Patient declined  Transportation Needs: Patient Declined (03/14/2023)   PRAPARE - Administrator, Civil Service (Medical): Patient declined    Lack of Transportation (Non-Medical): Patient declined  Physical Activity: Insufficiently Active (03/14/2023)   Exercise Vital Sign    Days of Exercise per Week: 4 days    Minutes of Exercise per Session: 30 min  Stress: No Stress Concern Present (03/14/2023)   [employer] of Occupational Health - Occupational Stress Questionnaire    Feeling of Stress : Only a little  Social Connections: Unknown (03/14/2023)   Social Connection and Isolation Panel    Frequency of Communication with Friends and Family: Patient declined    Frequency of Social Gatherings with Friends and Family: Patient declined    Attends Religious Services: Patient declined    Database Administrator or Organizations: Yes    Attends Engineer, Structural: 1 to  4 times per year    Marital Status: Patient declined  Catering Manager Violence: Not on file    Family History: Family History  Problem Relation Age of Onset   Hypertension Mother    Cancer Father        lung   Breast cancer Maternal Aunt     Review of Systems: Denies any recent fevers, chills or changes in health  Physical Exam: Vital Signs BP 130/81 (BP Location: Left Arm, Patient Position: Sitting, Cuff Size: Normal)   Pulse 72   Ht 5' 3 (1.6 m)   Wt 183 lb 9.6 oz (83.3 kg)   SpO2 98%   BMI 32.52 kg/m   Physical Exam  Constitutional:      General: Not in acute distress.    Appearance: Normal appearance. Not ill-appearing.  HENT:     Head: Normocephalic and atraumatic.  Eyes:     Pupils: Pupils are equal, round Neck:     Musculoskeletal: Normal range of motion.  Cardiovascular:     Rate and Rhythm: Normal rate    Pulses: Normal pulses.  Pulmonary:     Effort: Pulmonary effort is normal. No respiratory distress.  Musculoskeletal: Normal range of motion.  Skin:    General: Skin is warm and dry.     Findings: No erythema or rash.  Neurological:     Mental Status: Alert and oriented to person, place, and time. Mental status is at baseline.  Psychiatric:        Mood and Affect: Mood normal.        Behavior: Behavior normal.    Assessment/Plan: The patient is scheduled for bilateral breast reduction with Dr. Lowery.  Risks, benefits, and alternatives of procedure discussed, questions answered and consent obtained.    Will send clearance to patient's PCP.  Will also send clearance to OB/GYN provider to request patient to hold estrogen patch 2 weeks before/after surgery.  Clearance forms were given to clinical staff to fax off.  Smoking Status: Nonsmoker; Counseling Given? N/A Last Mammogram: 02/23/2024; Results: BI-RADS Category 0, incomplete -  In the right breast, a possible mass warrants further evaluation. In the left breast, no findings suspicious for  malignancy.  Patient then had diagnostic unilateral right breast mammogram on 03/03/2024 which was BI-RADS Category 2 benign.  Caprini Score: 5; Risk Factors include: Age, BMI > 25, hormone replacement and length of planned surgery. Recommendation for mechanical prophylaxis. Encourage early ambulation.   Pictures obtained: @consult   Post-op Rx sent to pharmacy: Tramadol , Zofran , Keflex   Instructed patient to hold any multivitamins or supplements at least 1 week prior to surgery.  Patient reports she is no longer taking the meloxicam , gabapentin  or Flexeril .  Instructed patient to hold her valsartan  the day of surgery.  We also discussed that estrogen may increase her risk of developing blood clots.  Recommended that she hold her estrogen patch 2 weeks before and 2 weeks after surgery.  Will obtain clearance from her OB/GYN provider who prescribes this.  Patient expressed understanding.  Patient was provided with the breast reduction and General Surgical Risk consent document and Pain Medication Agreement prior to their appointment.  They had adequate time to read through the risk consent documents and Pain Medication Agreement. We also discussed them in person together during this preop appointment. All of their questions were answered to their satisfaction.  Recommended calling if they have any further questions.  Risk consent form and Pain Medication Agreement to be scanned into patient's chart.  The risk that can be encountered with breast reduction were discussed and include the following but not limited to these:  Breast asymmetry, fluid accumulation, firmness of the breast, inability to breast feed, loss of nipple or areola, skin loss, decrease or no nipple sensation, fat necrosis of the breast tissue, bleeding, infection, healing delay.  There are risks of anesthesia, changes to skin sensation and injury to nerves or blood vessels.  The muscle can be temporarily or permanently injured.  You may  have an allergic reaction to tape, suture, glue, blood products which can result in skin discoloration, swelling, pain, skin lesions, poor healing.  Any of these can lead to the need for revisonal surgery or stage procedures.  A reduction has potential to interfere with diagnostic procedures.  Nipple or breast piercing can increase risks of infection.  This procedure is best done when the breast is fully developed.  Changes in the breast will continue to occur over time.  Pregnancy can alter the outcomes of previous breast reduction surgery, weight gain and weigh loss can also effect the long term appearance.     Electronically signed by: Estefana FORBES Peck, PA-C 04/30/2024 3:54 PM

## 2024-04-29 NOTE — Progress Notes (Unsigned)
 Patient ID: Barbara Ortega, female    DOB: Jan 13, 1976, 48 y.o.   MRN: [id number]  Chief Complaint  Patient presents with   Pre-op Exam      ICD-10-CM   1. Symptomatic mammary hypertrophy  N62        History of Present Illness: Barbara Ortega is a 48 y.o.  female  with a history of macromastia.  She presents for preoperative evaluation for upcoming procedure, Bilateral Breast Reduction with liposuction, scheduled for 05/26/2024 with Dr.  Lowery  The patient reports that the first time she had anesthesia, she became nauseous afterwards.  She states that when she had a colonoscopy recently, she did not have any issues with anesthesia.  Patient reports that her mammogram back in October was normal.  She reports that her aunt and her mother had breast cancer.  She denies any personal history of breast cancer.  She denies any history of cardiac disease.  She denies taking any blood thinners.  Patient reports she is not a smoker.  Patient reports she currently has a Mirena IUD in place.  Patient also states that she is on an estrogen patch for hormone replacement for menopause.  She denies any history of greater than 3 miscarriages.  She denies any personal family history of blood clots or clotting diseases.  She denies any recent surgeries, traumas or infections.  She denies any history of stroke or heart attack.  She denies any history of Crohn's disease or ulcerative colitis, COPD or asthma.  She denies any history of cancer.  She denies any varicosities to her lower extremities.  She denies any recent fevers, chills or changes in her health.  Patient reports she is currently a 38 DDD cup, and states that she would like to be a full B cup.  I discussed with the patient that cup size cannot be guaranteed.  Patient expressed understanding.  Patient does report that around 10 years ago, she was diagnosed with alpha 1 antitrypsin deficiency.  She states that this was tested because she  had elevated liver enzymes.  She states that she did see a hepatologist at the time, but has not followed up with them in several years.  She states that she had a biopsy of her liver done at the time of her diagnosis and it did not show anything concerning per the patient.  She states that she has not had any issues since she was diagnosed with the alpha 1 antitrypsin deficiency.  She denies any pulmonary issues as well.  Summary of Previous Visit: Patient was seen for initial consult by Dr. Lowery on 12/19/2023.  At this visit, patient appointment of her back and neck pain due to her enlarged breast.  On exam, her STN on the right was 35 cm and her STN on the left was 36 cm.  Her BMI was 31.4 kg/m.  Her preoperative bra size was a DDD cup, patient reported she wanted to be a C cup.  It was estimated that 785 g could be removed from each breast at the time of surgery.  The patient was found to be a good candidate for bilateral breast reduction with lateral liposuction.  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  Understands that she will have restrictions for 6 weeks postoperatively.  PMH Significant for: Hypertension, alpha 1 antitrypsin deficiency, symptomatic mammary hypertrophy  Past Medical History: Allergies: Allergies  Allergen Reactions   Elemental Sulfur Hives    Current Medications:  Current Outpatient Medications:    azelastine  (ASTELIN ) 0.1 % nasal spray, Place 2 sprays into both nostrils 2 (two) times daily. Use in each nostril as directed, Disp: 30 mL, Rfl: 12   cephALEXin  (KEFLEX ) 500 MG capsule, Take 1 capsule (500 mg total) by mouth 4 (four) times daily for 3 days., Disp: 12 capsule, Rfl: 0   cetirizine (ZYRTEC) 10 MG tablet, Take 10 mg by mouth daily., Disp: , Rfl:    levonorgestrel (MIRENA) 20 MCG/24HR IUD, 1 each by Intrauterine route once., Disp: , Rfl:    Multiple Vitamin  (MULTIVITAMIN) tablet, Take 1 tablet by mouth daily., Disp: , Rfl:    ondansetron  (ZOFRAN ) 4 MG tablet, Take 1 tablet (4 mg total) by mouth every 8 (eight) hours as needed for up to 10 doses for nausea or vomiting., Disp: 10 tablet, Rfl: 0   traMADol  (ULTRAM ) 50 MG tablet, Take 1 tablet (50 mg total) by mouth every 8 (eight) hours as needed for up to 15 doses for moderate pain (pain score 4-6) or severe pain (pain score 7-10)., Disp: 15 tablet, Rfl: 0   valsartan -hydrochlorothiazide  (DIOVAN -HCT) 160-12.5 MG tablet, TAKE 1 TABLET BY MOUTH DAILY, Disp: 90 tablet, Rfl: 1   cyclobenzaprine  (FLEXERIL ) 5 MG tablet, Take 1 tablet (5 mg total) by mouth 3 (three) times daily as needed for muscle spasms., Disp: 30 tablet, Rfl: 0   gabapentin  (NEURONTIN ) 300 MG capsule, TAKE 1 CAPSULE(300 MG) BY MOUTH AT BEDTIME, Disp: 90 capsule, Rfl: 3   meloxicam  (MOBIC ) 15 MG tablet, TAKE 1 TABLET(15 MG) BY MOUTH DAILY, Disp: 30 tablet, Rfl: 0  Past Medical Problems: Past Medical History:  Diagnosis Date   Alpha-1-antitrypsin deficiency (HCC)    Basal cell carcinoma 04/29/2008   Left medial infraocular   Dysplastic nevus 01/24/2009   Left 3rd distal toe, periungual. Moderate atypia, close to margin   Hypertension    Migraine    Sinus disease     Past Surgical History: Past Surgical History:  Procedure Laterality Date   BREAST BIOPSY Right 2019   Benign   BREAST BIOPSY Left 2012   benign   COLONOSCOPY WITH PROPOFOL  N/A 01/02/2021   Procedure: COLONOSCOPY WITH PROPOFOL ;  Surgeon: Janalyn Keene NOVAK, MD;  Location: [facility];  Service: Endoscopy;  Laterality: N/A;   NASAL SINUS SURGERY  2011   Dr. Edda   POLYPECTOMY  01/02/2021   Procedure: POLYPECTOMY;  Surgeon: Janalyn Keene NOVAK, MD;  Location: [facility];  Service: Endoscopy;;   VAGINAL DELIVERY     1   WISDOM TOOTH EXTRACTION      Social History: Social History   Socioeconomic History   Marital status: Married    Spouse  name: Not on file   Number of children: Not on file   Years of education: Not on file   Highest education level: Master's degree (e.g., MA, MS, MEng, MEd, MSW, MBA)  Occupational History   Not on file  Tobacco Use   Smoking status: Never   Smokeless tobacco: Never  Vaping Use   Vaping status: Never Used  Substance and Sexual Activity   Alcohol use: No    Alcohol/week: 0.0 standard drinks of alcohol   Drug use: No   Sexual activity: Not on file  Other Topics Concern   Not on file  Social History Narrative   Lives with son in [place].  Work - Furniture Conservator/restorer, Geographical Information Systems Officer      Diet - regular      Exercise- occasional   Social Drivers of Corporate Investment Banker Strain: Patient Declined (03/14/2023)   Overall Financial Resource Strain (CARDIA)    Difficulty of Paying Living Expenses: Patient declined  Food Insecurity: Patient Declined (03/14/2023)   Hunger Vital Sign    Worried About Running Out of Food in the Last Year: Patient declined    Ran Out of Food in the Last Year: Patient declined  Transportation Needs: Patient Declined (03/14/2023)   PRAPARE - Administrator, Civil Service (Medical): Patient declined    Lack of Transportation (Non-Medical): Patient declined  Physical Activity: Insufficiently Active (03/14/2023)   Exercise Vital Sign    Days of Exercise per Week: 4 days    Minutes of Exercise per Session: 30 min  Stress: No Stress Concern Present (03/14/2023)   [employer] of Occupational Health - Occupational Stress Questionnaire    Feeling of Stress : Only a little  Social Connections: Unknown (03/14/2023)   Social Connection and Isolation Panel    Frequency of Communication with Friends and Family: Patient declined    Frequency of Social Gatherings with Friends and Family: Patient declined    Attends Religious Services: Patient declined    Database Administrator or Organizations: Yes    Attends Engineer, Structural: 1 to  4 times per year    Marital Status: Patient declined  Catering Manager Violence: Not on file    Family History: Family History  Problem Relation Age of Onset   Hypertension Mother    Cancer Father        lung   Breast cancer Maternal Aunt     Review of Systems: Denies any recent fevers, chills or changes in health  Physical Exam: Vital Signs BP 130/81 (BP Location: Left Arm, Patient Position: Sitting, Cuff Size: Normal)   Pulse 72   Ht 5' 3 (1.6 m)   Wt 183 lb 9.6 oz (83.3 kg)   SpO2 98%   BMI 32.52 kg/m   Physical Exam  Constitutional:      General: Not in acute distress.    Appearance: Normal appearance. Not ill-appearing.  HENT:     Head: Normocephalic and atraumatic.  Eyes:     Pupils: Pupils are equal, round Neck:     Musculoskeletal: Normal range of motion.  Cardiovascular:     Rate and Rhythm: Normal rate    Pulses: Normal pulses.  Pulmonary:     Effort: Pulmonary effort is normal. No respiratory distress.  Musculoskeletal: Normal range of motion.  Skin:    General: Skin is warm and dry.     Findings: No erythema or rash.  Neurological:     Mental Status: Alert and oriented to person, place, and time. Mental status is at baseline.  Psychiatric:        Mood and Affect: Mood normal.        Behavior: Behavior normal.    Assessment/Plan: The patient is scheduled for bilateral breast reduction with Dr. Lowery.  Risks, benefits, and alternatives of procedure discussed, questions answered and consent obtained.    Will send clearance to patient's PCP.  Will also send clearance to OB/GYN provider to request patient to hold estrogen patch 2 weeks before/after surgery.  Clearance forms were given to clinical staff to fax off.  Smoking Status: Nonsmoker; Counseling Given? N/A Last Mammogram: 02/23/2024; Results: BI-RADS Category 0, incomplete -  In the right breast, a possible mass warrants further evaluation. In the left breast, no findings suspicious for  malignancy.  Patient then had diagnostic unilateral right breast mammogram on 03/03/2024 which was BI-RADS Category 2 benign.  Caprini Score: 5; Risk Factors include: Age, BMI > 25, hormone replacement and length of planned surgery. Recommendation for mechanical prophylaxis. Encourage early ambulation.   Pictures obtained: @consult   Post-op Rx sent to pharmacy: Tramadol , Zofran , Keflex   Instructed patient to hold any multivitamins or supplements at least 1 week prior to surgery.  Patient reports she is no longer taking the meloxicam , gabapentin  or Flexeril .  Instructed patient to hold her valsartan  the day of surgery.  We also discussed that estrogen may increase her risk of developing blood clots.  Recommended that she hold her estrogen patch 2 weeks before and 2 weeks after surgery.  Will obtain clearance from her OB/GYN provider who prescribes this.  Patient expressed understanding.  Patient was provided with the breast reduction and General Surgical Risk consent document and Pain Medication Agreement prior to their appointment.  They had adequate time to read through the risk consent documents and Pain Medication Agreement. We also discussed them in person together during this preop appointment. All of their questions were answered to their satisfaction.  Recommended calling if they have any further questions.  Risk consent form and Pain Medication Agreement to be scanned into patient's chart.  The risk that can be encountered with breast reduction were discussed and include the following but not limited to these:  Breast asymmetry, fluid accumulation, firmness of the breast, inability to breast feed, loss of nipple or areola, skin loss, decrease or no nipple sensation, fat necrosis of the breast tissue, bleeding, infection, healing delay.  There are risks of anesthesia, changes to skin sensation and injury to nerves or blood vessels.  The muscle can be temporarily or permanently injured.  You may  have an allergic reaction to tape, suture, glue, blood products which can result in skin discoloration, swelling, pain, skin lesions, poor healing.  Any of these can lead to the need for revisonal surgery or stage procedures.  A reduction has potential to interfere with diagnostic procedures.  Nipple or breast piercing can increase risks of infection.  This procedure is best done when the breast is fully developed.  Changes in the breast will continue to occur over time.  Pregnancy can alter the outcomes of previous breast reduction surgery, weight gain and weigh loss can also effect the long term appearance.     Electronically signed by: Estefana FORBES Peck, PA-C 04/30/2024 3:54 PM

## 2024-04-30 ENCOUNTER — Encounter: Admitting: Student

## 2024-04-30 ENCOUNTER — Ambulatory Visit: Admitting: Student

## 2024-04-30 ENCOUNTER — Encounter: Payer: Self-pay | Admitting: Student

## 2024-04-30 VITALS — BP 130/81 | HR 72 | Ht 63.0 in | Wt 183.6 lb

## 2024-04-30 DIAGNOSIS — Z803 Family history of malignant neoplasm of breast: Secondary | ICD-10-CM

## 2024-04-30 DIAGNOSIS — N62 Hypertrophy of breast: Secondary | ICD-10-CM

## 2024-04-30 MED ORDER — TRAMADOL HCL 50 MG PO TABS: 50.0000 mg | ORAL_TABLET | Freq: Three times a day (TID) | ORAL | 0 refills | Status: DC | PRN

## 2024-04-30 MED ORDER — CEPHALEXIN 500 MG PO CAPS: 500.0000 mg | ORAL_CAPSULE | Freq: Four times a day (QID) | ORAL | 0 refills | Status: AC

## 2024-04-30 MED ORDER — ONDANSETRON HCL 4 MG PO TABS: 4.0000 mg | ORAL_TABLET | Freq: Three times a day (TID) | ORAL | 0 refills | Status: DC | PRN

## 2024-05-03 ENCOUNTER — Telehealth: Payer: Self-pay | Admitting: Nurse Practitioner

## 2024-05-03 NOTE — Telephone Encounter (Signed)
 Surgical Clearance has been place in back mailbox for [employer].

## 2024-05-07 NOTE — Telephone Encounter (Signed)
 Form faxed

## 2024-05-18 ENCOUNTER — Encounter (HOSPITAL_BASED_OUTPATIENT_CLINIC_OR_DEPARTMENT_OTHER): Payer: Self-pay | Admitting: Plastic Surgery

## 2024-05-21 ENCOUNTER — Ambulatory Visit: Admitting: Nurse Practitioner

## 2024-05-21 ENCOUNTER — Encounter (HOSPITAL_BASED_OUTPATIENT_CLINIC_OR_DEPARTMENT_OTHER)
Admission: RE | Admit: 2024-05-21 | Discharge: 2024-05-21 | Disposition: A | Discharge status: Home / Self Care | Source: Ambulatory Visit | Attending: Plastic Surgery | Admitting: Plastic Surgery

## 2024-05-21 ENCOUNTER — Encounter: Payer: Self-pay | Admitting: Nurse Practitioner

## 2024-05-21 VITALS — BP 138/88 | HR 65 | Temp 98.2°F | Ht 63.0 in | Wt 184.8 lb

## 2024-05-21 DIAGNOSIS — E781 Pure hyperglyceridemia: Secondary | ICD-10-CM | POA: Diagnosis not present

## 2024-05-21 DIAGNOSIS — Z01818 Encounter for other preprocedural examination: Secondary | ICD-10-CM | POA: Insufficient documentation

## 2024-05-21 DIAGNOSIS — I10 Essential (primary) hypertension: Secondary | ICD-10-CM | POA: Diagnosis not present

## 2024-05-21 LAB — BASIC METABOLIC PANEL WITH GFR
Anion gap: 11 (ref 5–15)
BUN: 16 mg/dL (ref 6–20)
CO2: 24 mmol/L (ref 22–32)
Calcium: 9.4 mg/dL (ref 8.9–10.3)
Chloride: 104 mmol/L (ref 98–111)
Creatinine, Ser: 0.86 mg/dL (ref 0.44–1.00)
GFR, Estimated: >60 mL/min
Glucose, Bld: 84 mg/dL (ref 70–99)
Potassium: 4.1 mmol/L (ref 3.5–5.1)
Sodium: 139 mmol/L (ref 135–145)

## 2024-05-21 NOTE — Assessment & Plan Note (Signed)
 Triglycerides are elevated but managed with diet and exercise. She is encouraged to continue dietary modifications and regular exercise. Check fasting lipid panel at next appointment.

## 2024-05-21 NOTE — Progress Notes (Signed)
 " Leron Glance, NP-C Phone: [phone]  Vivika Poythress Bonneau is a 48 y.o. female who presents today for transfer of care.   Discussed the use of AI scribe software for clinical note transcription with the patient, who gave verbal consent to proceed.  History of Present Illness   Sharmila Wrobleski Hilde Churchman is a 48 year old female who presents for a transfer of care.  She is experiencing menopausal symptoms, including hot flashes and sleep disturbances, which have significantly improved with estrogen therapy. She was on estrogen for a couple of weeks, which helped tremendously, but had to stop the medication two weeks prior to her upcoming breast reduction surgery. She notes a significant difference in symptoms since stopping the medication.  She is scheduled for a breast reduction surgery next week and feels both excited and nervous.  She has a history of hypertension and is currently on valsartan  and hydrochlorothiazide . She does not have a blood pressure monitor at home as she gave it to her mother. Her blood pressure readings at appointments have generally been well-controlled, though today's reading was higher than usual at 138/88 mmHg, which she attributes to stress related to her upcoming surgery and the holiday season. No chest pain, shortness of breath, dizziness, or swelling.  She mentions a history of high triglycerides, which she manages through diet and exercise, as she is not on medication for this condition. She takes a multivitamin regularly.   She is a runner, broadcasting/film/video and recently changed schools, now teaching at [employer]. She had a Pap smear and mammogram done recently, around Thanksgiving.      Tobacco Use History[1]  Medications Ordered Prior to Encounter[2]   ROS see history of present illness  Objective  Physical Exam Vitals:   05/21/24 1337  BP: 138/88  Pulse: 65  Temp: 98.2 F (36.8 C)  SpO2: 98%    BP Readings from Last 3 Encounters:  05/21/24 138/88  04/30/24  130/81  12/19/23 (!) 143/87   Wt Readings from Last 3 Encounters:  05/21/24 184 lb 12.8 oz (83.8 kg)  04/30/24 183 lb 9.6 oz (83.3 kg)  12/19/23 177 lb 6.4 oz (80.5 kg)    Physical Exam Constitutional:      General: She is not in acute distress.    Appearance: Normal appearance.  HENT:     Head: Normocephalic.  Cardiovascular:     Rate and Rhythm: Normal rate and regular rhythm.     Heart sounds: Normal heart sounds.  Pulmonary:     Effort: Pulmonary effort is normal.     Breath sounds: Normal breath sounds.  Skin:    General: Skin is warm and dry.  Neurological:     General: No focal deficit present.     Mental Status: She is alert.  Psychiatric:        Mood and Affect: Mood normal.        Behavior: Behavior normal.      Assessment/Plan: Please see individual problem list.  Essential hypertension, benign Assessment & Plan: Blood pressure is generally well-controlled with current medication, though today's reading was 138/88 mmHg. Continue valsartan  and hydrochlorothiazide . Monitor blood pressure regularly. BMP collected today through pre-op.   Hypertriglyceridemia Assessment & Plan: Triglycerides are elevated but managed with diet and exercise. She is encouraged to continue dietary modifications and regular exercise. Check fasting lipid panel at next appointment.      Return in about 6 months (around 11/19/2024) for Follow up.   Leron Glance, NP-C [facility]  [place]     [1]  Social History Tobacco Use  Smoking Status Never  Smokeless Tobacco Never  [2]  Current Outpatient Medications on File Prior to Visit  Medication Sig Dispense Refill   cetirizine (ZYRTEC) 10 MG tablet Take 10 mg by mouth daily.     levonorgestrel (MIRENA) 20 MCG/24HR IUD 1 each by Intrauterine route once.     Multiple Vitamin (MULTIVITAMIN) tablet Take 1 tablet by mouth daily.     valsartan -hydrochlorothiazide  (DIOVAN -HCT) 160-12.5 MG tablet TAKE 1 TABLET BY  MOUTH DAILY 90 tablet 1   No current facility-administered medications on file prior to visit.   "

## 2024-05-21 NOTE — Assessment & Plan Note (Signed)
 Blood pressure is generally well-controlled with current medication, though today's reading was 138/88 mmHg. Continue valsartan  and hydrochlorothiazide . Monitor blood pressure regularly. BMP collected today through pre-op.

## 2024-05-26 ENCOUNTER — Ambulatory Visit (HOSPITAL_BASED_OUTPATIENT_CLINIC_OR_DEPARTMENT_OTHER): Admitting: Anesthesiology

## 2024-05-26 ENCOUNTER — Encounter (HOSPITAL_BASED_OUTPATIENT_CLINIC_OR_DEPARTMENT_OTHER): Payer: Self-pay | Admitting: Plastic Surgery

## 2024-05-26 ENCOUNTER — Other Ambulatory Visit: Payer: Self-pay

## 2024-05-26 ENCOUNTER — Encounter (HOSPITAL_BASED_OUTPATIENT_CLINIC_OR_DEPARTMENT_OTHER)
Admission: RE | Disposition: A | Discharge status: Home / Self Care | Payer: Self-pay | Source: Home / Self Care | Attending: Plastic Surgery

## 2024-05-26 ENCOUNTER — Ambulatory Visit (HOSPITAL_BASED_OUTPATIENT_CLINIC_OR_DEPARTMENT_OTHER)
Admission: RE | Admit: 2024-05-26 | Discharge: 2024-05-26 | Disposition: A | Discharge status: Home / Self Care | Attending: Plastic Surgery | Admitting: Plastic Surgery

## 2024-05-26 DIAGNOSIS — Z01818 Encounter for other preprocedural examination: Secondary | ICD-10-CM

## 2024-05-26 DIAGNOSIS — Z975 Presence of (intrauterine) contraceptive device: Secondary | ICD-10-CM | POA: Diagnosis not present

## 2024-05-26 DIAGNOSIS — N62 Hypertrophy of breast: Secondary | ICD-10-CM | POA: Diagnosis present

## 2024-05-26 DIAGNOSIS — Z803 Family history of malignant neoplasm of breast: Secondary | ICD-10-CM | POA: Insufficient documentation

## 2024-05-26 DIAGNOSIS — M542 Cervicalgia: Secondary | ICD-10-CM

## 2024-05-26 DIAGNOSIS — Z79890 Hormone replacement therapy (postmenopausal): Secondary | ICD-10-CM | POA: Insufficient documentation

## 2024-05-26 DIAGNOSIS — E8801 Alpha-1-antitrypsin deficiency: Secondary | ICD-10-CM | POA: Insufficient documentation

## 2024-05-26 DIAGNOSIS — M549 Dorsalgia, unspecified: Secondary | ICD-10-CM

## 2024-05-26 DIAGNOSIS — I10 Essential (primary) hypertension: Secondary | ICD-10-CM | POA: Insufficient documentation

## 2024-05-26 HISTORY — DX: Nausea with vomiting, unspecified: R11.2

## 2024-05-26 HISTORY — PX: BREAST REDUCTION SURGERY: SHX8

## 2024-05-26 LAB — POCT PREGNANCY, URINE: Preg Test, Ur: NEGATIVE

## 2024-05-26 SURGERY — BREAST REDUCTION WITH LIPOSUCTION
Anesthesia: General | Site: Breast | Laterality: Bilateral

## 2024-05-26 MED ORDER — FENTANYL CITRATE (PF) 100 MCG/2ML IJ SOLN
25.0000 ug | INTRAMUSCULAR | Status: DC | PRN
  Administered 2024-05-26: 25 ug via INTRAVENOUS
  Administered 2024-05-26: 50 ug via INTRAVENOUS

## 2024-05-26 MED ORDER — BUPIVACAINE LIPOSOME 1.3 % IJ SUSP
INTRAMUSCULAR | Status: AC
  Filled 2024-05-26: qty 20

## 2024-05-26 MED ORDER — OXYCODONE HCL 5 MG PO TABS
ORAL_TABLET | ORAL | Status: AC
  Filled 2024-05-26: qty 1

## 2024-05-26 MED ORDER — PHENYLEPHRINE 80 MCG/ML (10ML) SYRINGE FOR IV PUSH (FOR BLOOD PRESSURE SUPPORT)
PREFILLED_SYRINGE | INTRAVENOUS | Status: AC
  Filled 2024-05-26: qty 10

## 2024-05-26 MED ORDER — EPHEDRINE SULFATE (PRESSORS) 25 MG/5ML IV SOSY
PREFILLED_SYRINGE | INTRAVENOUS | Status: DC | PRN
  Administered 2024-05-26 (×2): 5 mg via INTRAVENOUS

## 2024-05-26 MED ORDER — ONDANSETRON HCL 4 MG/2ML IJ SOLN
INTRAMUSCULAR | Status: AC
  Filled 2024-05-26: qty 4

## 2024-05-26 MED ORDER — LACTATED RINGERS IV SOLN: INTRAVENOUS | Status: DC

## 2024-05-26 MED ORDER — EPHEDRINE 5 MG/ML INJ
INTRAVENOUS | Status: AC
  Filled 2024-05-26: qty 5

## 2024-05-26 MED ORDER — FENTANYL CITRATE (PF) 100 MCG/2ML IJ SOLN
INTRAMUSCULAR | Status: AC
  Filled 2024-05-26: qty 2

## 2024-05-26 MED ORDER — CHLORHEXIDINE GLUCONATE CLOTH 2 % EX PADS: 6.0000 | MEDICATED_PAD | Freq: Once | CUTANEOUS | Status: DC

## 2024-05-26 MED ORDER — PROPOFOL 500 MG/50ML IV EMUL
INTRAVENOUS | Status: DC | PRN
  Administered 2024-05-26: 50 ug/kg/min via INTRAVENOUS

## 2024-05-26 MED ORDER — LIDOCAINE 2% (20 MG/ML) 5 ML SYRINGE
INTRAMUSCULAR | Status: AC
  Filled 2024-05-26: qty 5

## 2024-05-26 MED ORDER — SODIUM CHLORIDE 0.9% FLUSH: 3.0000 mL | INTRAVENOUS | Status: DC | PRN

## 2024-05-26 MED ORDER — MIDAZOLAM HCL 5 MG/5ML IJ SOLN
INTRAMUSCULAR | Status: DC | PRN
  Administered 2024-05-26: 2 mg via INTRAVENOUS

## 2024-05-26 MED ORDER — SCOPOLAMINE 1 MG/3DAYS TD PT72
MEDICATED_PATCH | TRANSDERMAL | Status: AC
  Filled 2024-05-26: qty 1

## 2024-05-26 MED ORDER — ACETAMINOPHEN 325 MG RE SUPP: 650.0000 mg | RECTAL | Status: DC | PRN

## 2024-05-26 MED ORDER — DEXAMETHASONE SOD PHOSPHATE PF 10 MG/ML IJ SOLN
INTRAMUSCULAR | Status: DC | PRN
  Administered 2024-05-26: 10 mg via INTRAVENOUS

## 2024-05-26 MED ORDER — VASHE WOUND IRRIGATION OPTIME
TOPICAL | Status: DC | PRN
  Administered 2024-05-26: 34 [oz_av] via TOPICAL

## 2024-05-26 MED ORDER — OXYCODONE HCL 5 MG PO TABS
5.0000 mg | ORAL_TABLET | Freq: Once | ORAL | Status: AC | PRN
  Administered 2024-05-26: 5 mg via ORAL

## 2024-05-26 MED ORDER — PROPOFOL 10 MG/ML IV BOLUS
INTRAVENOUS | Status: AC
  Filled 2024-05-26: qty 20

## 2024-05-26 MED ORDER — NITROGLYCERIN 2 % TD OINT
TOPICAL_OINTMENT | TRANSDERMAL | Status: DC | PRN
  Administered 2024-05-26: 1 [in_us] via TOPICAL

## 2024-05-26 MED ORDER — ONDANSETRON HCL 4 MG/2ML IJ SOLN
INTRAMUSCULAR | Status: DC | PRN
  Administered 2024-05-26: 4 mg via INTRAVENOUS

## 2024-05-26 MED ORDER — LIDOCAINE HCL 1 % IJ SOLN
INTRAVENOUS | Status: DC | PRN
  Administered 2024-05-26: 1000 mL

## 2024-05-26 MED ORDER — SODIUM CHLORIDE 0.9% FLUSH: 3.0000 mL | Freq: Two times a day (BID) | INTRAVENOUS | Status: DC

## 2024-05-26 MED ORDER — LIDOCAINE-EPINEPHRINE 1 %-1:100000 IJ SOLN: INTRAMUSCULAR | Status: DC | PRN

## 2024-05-26 MED ORDER — BUPIVACAINE LIPOSOME 1.3 % IJ SUSP
INTRAMUSCULAR | Status: DC | PRN
  Administered 2024-05-26: 40 mL via SURGICAL_CAVITY

## 2024-05-26 MED ORDER — ACETAMINOPHEN 325 MG PO TABS: 650.0000 mg | ORAL_TABLET | ORAL | Status: DC | PRN

## 2024-05-26 MED ORDER — OXYCODONE HCL 5 MG/5ML PO SOLN: 5.0000 mg | Freq: Once | ORAL | Status: AC | PRN

## 2024-05-26 MED ORDER — FENTANYL CITRATE (PF) 100 MCG/2ML IJ SOLN
INTRAMUSCULAR | Status: DC | PRN
  Administered 2024-05-26: 50 ug via INTRAVENOUS
  Administered 2024-05-26 (×4): 25 ug via INTRAVENOUS

## 2024-05-26 MED ORDER — PROPOFOL 10 MG/ML IV BOLUS
INTRAVENOUS | Status: DC | PRN
  Administered 2024-05-26: 180 mg via INTRAVENOUS

## 2024-05-26 MED ORDER — LIDOCAINE HCL (CARDIAC) PF 100 MG/5ML IV SOSY
PREFILLED_SYRINGE | INTRAVENOUS | Status: DC | PRN
  Administered 2024-05-26: 60 mg via INTRAVENOUS

## 2024-05-26 MED ORDER — PHENYLEPHRINE HCL (PRESSORS) 10 MG/ML IV SOLN
INTRAVENOUS | Status: DC | PRN
  Administered 2024-05-26: 80 ug via INTRAVENOUS

## 2024-05-26 MED ORDER — FENTANYL CITRATE (PF) 100 MCG/2ML IJ SOLN: 25.0000 ug | INTRAMUSCULAR | Status: DC | PRN

## 2024-05-26 MED ORDER — MIDAZOLAM HCL 2 MG/2ML IJ SOLN
INTRAMUSCULAR | Status: AC
  Filled 2024-05-26: qty 2

## 2024-05-26 MED ORDER — SODIUM CHLORIDE 0.9 % IV SOLN: 250.0000 mL | INTRAVENOUS | Status: DC | PRN

## 2024-05-26 MED ORDER — OXYCODONE HCL 5 MG PO TABS: 5.0000 mg | ORAL_TABLET | ORAL | Status: DC | PRN

## 2024-05-26 MED ORDER — SCOPOLAMINE 1 MG/3DAYS TD PT72
1.0000 | MEDICATED_PATCH | TRANSDERMAL | Status: DC
  Administered 2024-05-26: 1 mg via TRANSDERMAL

## 2024-05-26 MED ORDER — CEFAZOLIN SODIUM-DEXTROSE 2-4 GM/100ML-% IV SOLN
INTRAVENOUS | Status: AC
  Filled 2024-05-26: qty 100

## 2024-05-26 MED ORDER — ONDANSETRON HCL 4 MG/2ML IJ SOLN: 4.0000 mg | Freq: Four times a day (QID) | INTRAMUSCULAR | Status: DC | PRN

## 2024-05-26 MED ORDER — CEFAZOLIN SODIUM-DEXTROSE 2-4 GM/100ML-% IV SOLN
2.0000 g | INTRAVENOUS | Status: AC
  Administered 2024-05-26: 2 g via INTRAVENOUS

## 2024-05-26 SURGICAL SUPPLY — 65 items
BINDER BREAST LRG (GAUZE/BANDAGES/DRESSINGS) IMPLANT
BINDER BREAST MEDIUM (GAUZE/BANDAGES/DRESSINGS) IMPLANT
BINDER BREAST XLRG (GAUZE/BANDAGES/DRESSINGS) IMPLANT
BINDER BREAST XXLRG (GAUZE/BANDAGES/DRESSINGS) IMPLANT
BIOPATCH RED 1 DISK 7.0 (GAUZE/BANDAGES/DRESSINGS) IMPLANT
BLADE HEX COATED 2.75 (ELECTRODE) IMPLANT
BLADE KNIFE PERSONA 10 (BLADE) ×2 IMPLANT
BLADE SURG 15 STRL LF DISP TIS (BLADE) ×1 IMPLANT
CANISTER SUCT 1200ML W/VALVE (MISCELLANEOUS) ×1 IMPLANT
CLEANSER WND VASHE 34 (WOUND CARE) ×1 IMPLANT
COTTONBALL LRG STERILE PKG (GAUZE/BANDAGES/DRESSINGS) IMPLANT
COVER BACK TABLE 60X90IN (DRAPES) ×1 IMPLANT
COVER MAYO STAND STRL (DRAPES) ×1 IMPLANT
DERMABOND ADVANCED .7 DNX12 (GAUZE/BANDAGES/DRESSINGS) ×2 IMPLANT
DRAIN CHANNEL 15F RND FF W/TCR (WOUND CARE) IMPLANT
DRAIN CHANNEL 19F RND (DRAIN) IMPLANT
DRAPE LAPAROSCOPIC ABDOMINAL (DRAPES) ×1 IMPLANT
DRAPE UTILITY XL STRL (DRAPES) ×1 IMPLANT
DRSG MEPILEX POST OP 4X8 (GAUZE/BANDAGES/DRESSINGS) ×2 IMPLANT
DRSG TEGADERM 4X4.75 (GAUZE/BANDAGES/DRESSINGS) IMPLANT
DRSG TELFA 3X8 NADH STRL (GAUZE/BANDAGES/DRESSINGS) IMPLANT
ELECTRODE BLDE 4.0 EZ CLN MEGD (MISCELLANEOUS) ×1 IMPLANT
ELECTRODE REM PT RTRN 9FT ADLT (ELECTROSURGICAL) ×1 IMPLANT
EVACUATOR SILICONE 100CC (DRAIN) IMPLANT
GAUZE PAD ABD 8X10 STRL (GAUZE/BANDAGES/DRESSINGS) ×2 IMPLANT
GAUZE XEROFORM 5X9 LF (GAUZE/BANDAGES/DRESSINGS) IMPLANT
GLOVE BIO SURGEON STRL SZ 6.5 (GLOVE) ×2 IMPLANT
GLOVE BIO SURGEON STRL SZ7.5 (GLOVE) IMPLANT
GLOVE BIOGEL PI IND STRL 7.0 (GLOVE) IMPLANT
GLOVE BIOGEL PI IND STRL 8 (GLOVE) IMPLANT
GOWN STRL REUS W/ TWL LRG LVL3 (GOWN DISPOSABLE) ×1 IMPLANT
GOWN STRL REUS W/ TWL XL LVL3 (GOWN DISPOSABLE) IMPLANT
GOWN STRL REUS W/TWL XL LVL3 (GOWN DISPOSABLE) ×1 IMPLANT
LINER CANISTER 1000CC FLEX (MISCELLANEOUS) ×1 IMPLANT
NEEDLE FILTER BLUNT 18X1 1/2 (NEEDLE) IMPLANT
NEEDLE HYPO 25X1 1.5 SAFETY (NEEDLE) ×2 IMPLANT
PACK BASIN DAY SURGERY FS (CUSTOM PROCEDURE TRAY) ×1 IMPLANT
PAD ALCOHOL SWAB (MISCELLANEOUS) IMPLANT
PAD FOAM SILICONE BACKED (GAUZE/BANDAGES/DRESSINGS) IMPLANT
PENCIL SMOKE EVACUATOR (MISCELLANEOUS) ×1 IMPLANT
PIN SAFETY STERILE (MISCELLANEOUS) IMPLANT
POWDER MYRIAD MORCLLS FINE 500 (Miscellaneous) IMPLANT
SLEEVE SCD COMPRESS KNEE MED (STOCKING) ×1 IMPLANT
SOL PREP POV-IOD 4OZ 10% (MISCELLANEOUS) ×1 IMPLANT
SOLN 0.9% NACL POUR BTL 1000ML (IV SOLUTION) IMPLANT
SPIKE FLUID TRANSFER (MISCELLANEOUS) IMPLANT
SPONGE T-LAP 18X18 ~~LOC~~+RFID (SPONGE) ×2 IMPLANT
STRIP SUTURE WOUND CLOSURE 1/2 (MISCELLANEOUS) ×4 IMPLANT
SUT MNCRL AB 4-0 PS2 18 (SUTURE) ×4 IMPLANT
SUT MON AB 3-0 SH27 (SUTURE) ×4 IMPLANT
SUT MON AB 5-0 PS2 18 (SUTURE) IMPLANT
SUT PDS II 3-0 CT2 27 ABS (SUTURE) ×4 IMPLANT
SUT SILK 3 0 PS 1 (SUTURE) IMPLANT
SUT VIC AB 4-0 PS2 27 (SUTURE) IMPLANT
SYR 50ML LL SCALE MARK (SYRINGE) IMPLANT
SYR BULB IRRIG 60ML STRL (SYRINGE) ×1 IMPLANT
SYR CONTROL 10ML LL (SYRINGE) ×2 IMPLANT
TAPE MEASURE VINYL STERILE (MISCELLANEOUS) IMPLANT
TOWEL GREEN STERILE FF (TOWEL DISPOSABLE) ×3 IMPLANT
TRAY DSU PREP LF (CUSTOM PROCEDURE TRAY) ×1 IMPLANT
TUBE CONNECTING 20X1/4 (TUBING) ×1 IMPLANT
TUBING INFILTRATION IT-10001 (TUBING) IMPLANT
TUBING SET GRADUATE ASPIR 12FT (MISCELLANEOUS) IMPLANT
UNDERPAD 30X36 HEAVY ABSORB (UNDERPADS AND DIAPERS) ×2 IMPLANT
YANKAUER SUCT BULB TIP NO VENT (SUCTIONS) ×1 IMPLANT

## 2024-05-26 NOTE — Discharge Instructions (Addendum)
 INSTRUCTIONS FOR AFTER BREAST SURGERY   You will likely have some questions about what to expect following your operation.  The following information will help you and your family understand what to expect when you are discharged from the hospital.  It is important to follow these guidelines to help ensure a smooth recovery and reduce complication.  Postoperative instructions include information on: diet, wound care, medications and physical activity.  AFTER SURGERY Expect to go home after the procedure.  In some cases, you may need to spend one night in the hospital for observation.  DIET Breast surgery does not require a specific diet.  However, the healthier you eat the better your body will heal. It is important to increasing your protein intake.  This means limiting the foods with sugar and carbohydrates.  Focus on vegetables and some meat.  If you have liposuction during your procedure be sure to drink water .  If your urine is bright yellow, then it is concentrated, and you need to drink more water .  As a general rule after surgery, you should have 8 ounces of water  every hour while awake.  If you find you are persistently nauseated or unable to take in liquids let us  know.  NO TOBACCO USE or EXPOSURE.  This will slow your healing process and lead to a wound.  WOUND CARE Leave the binder on for 3 days . Use fragrance free soap like Dial, Dove or Ivory.   After 3 days you can remove the binder to shower. Once dry apply binder or sports bra. If you have liposuction you will have a soft and spongy dressing (Lipofoam) that helps prevent creases in your skin.  Remove before you shower and then replace it.  It is also available on [employer]. If you have steri-strips / tape directly attached to your skin leave them in place. It is OK to get these wet.   No baths, pools or hot tubs for four weeks. We close your incision to leave the smallest and best-looking scar. No ointment or creams on your incisions  for four weeks.  No Neosporin (Too many skin reactions).  A few weeks after surgery you can use Mederma and start massaging the scar. We ask you to wear your binder or sports bra for the first 6 weeks around the clock, including while sleeping. This provides added comfort and helps reduce the fluid accumulation at the surgery site. NO Ice or heating pads to the operative site.  You have a very high risk of a BURN before you feel the temperature change.  ACTIVITY No heavy lifting until cleared by the doctor.  This usually means no more than a half-gallon of milk.  It is OK to walk and climb stairs. Moving your legs is very important to decrease your risk of a blood clot.  It will also help keep you from getting deconditioned.  Every 1 to 2 hours get up and walk for 5 minutes. This will help with a quicker recovery back to normal.  Let pain be your guide so you don't do too much.  This time is for you to recover.  You will be more comfortable if you sleep and rest with your head elevated either with a few pillows under you or in a recliner.  No stomach sleeping for a three months.  WORK Everyone returns to work at different times. As a rough guide, most people take at least 1 - 2 weeks off prior to returning to work. If  you need documentation for your job, give the forms to the front staff at the clinic.  DRIVING Arrange for someone to bring you home from the hospital after your surgery.  You may be able to drive a few days after surgery but not while taking any narcotics or valium.  BOWEL MOVEMENTS Constipation can occur after anesthesia and while taking pain medication.  It is important to stay ahead for your comfort.  We recommend taking Milk of Magnesia (2 tablespoons; twice a day) while taking the pain pills.  MEDICATIONS You may be prescribed should start after surgery At your preoperative visit for you history and physical you were given the following medications: Antibiotic: Start this  medication when you get home and take according to the instructions on the bottle. Zofran  4 mg:  This is to treat nausea and vomiting.  You can take this every 6 hours as needed and only if needed. Oxycodone 5 mg every 6 hours for 3 - 5 days.  This is to be used after you have taken the Motrin  or the Tylenol . 4.   Gabapentin  300 mg every 12 hours for 7 days.  Over the counter Medication to take: Ibuprofen  (Motrin ) 400 - 600 mg every 6 hour for 7 days Tylenol  500 mg every 6 hours for 7 days.  Only take the Oxycodone after you have tried these two. MiraLAX or stool softener of choice: Take this according to the bottle if you take the Norco.  If muscle work done:  Flexeril  5 mg every 12 hours for 7 days.  WHEN TO CALL Call your surgeon's office if any of the following occur: Fever 101 degrees F or greater Excessive bleeding or fluid from the incision site. Pain that increases over time without aid from the medications Redness, warmth, or pus draining from incision sites Persistent nausea or inability to take in liquids Severe misshapen area that underwent the operation  Post Anesthesia Home Care Instructions  Activity: Get plenty of rest for the remainder of the day. A responsible individual must stay with you for 24 hours following the procedure.  For the next 24 hours, DO NOT: -Drive a car -Advertising copywriter -Drink alcoholic beverages -Take any medication unless instructed by your physician -Make any legal decisions or sign important papers.  Meals: Start with liquid foods such as gelatin or soup. Progress to regular foods as tolerated. Avoid greasy, spicy, heavy foods. If nausea and/or vomiting occur, drink only clear liquids until the nausea and/or vomiting subsides. Call your physician if vomiting continues.  Special Instructions/Symptoms: Your throat may feel dry or sore from the anesthesia or the breathing tube placed in your throat during surgery. If this causes discomfort,  gargle with warm salt water . The discomfort should disappear within 24 hours.  If you had a scopolamine  patch placed behind your ear for the management of post- operative nausea and/or vomiting:  1. The medication in the patch is effective for 72 hours, after which it should be removed.  Wrap patch in a tissue and discard in the trash. Wash hands thoroughly with soap and water . 2. You may remove the patch earlier than 72 hours if you experience unpleasant side effects which may include dry mouth, dizziness or visual disturbances. 3. Avoid touching the patch. Wash your hands with soap and water  after contact with the patch.  Information for Discharge Teaching: EXPAREL  (bupivacaine  liposome injectable suspension)   Pain relief is important to your recovery. The goal is to control your pain so  you can move easier and return to your normal activities as soon as possible after your procedure. Your physician may use several types of medicines to manage pain, swelling, and more.  Your surgeon or anesthesiologist gave you EXPAREL (bupivacaine ) to help control your pain after surgery.  EXPAREL  is a local anesthetic designed to release slowly over an extended period of time to provide pain relief by numbing the tissue around the surgical site. EXPAREL  is designed to release pain medication over time and can control pain for up to 72 hours. Depending on how you respond to EXPAREL , you may require less pain medication during your recovery. EXPAREL  can help reduce or eliminate the need for opioids during the first few days after surgery when pain relief is needed the most. EXPAREL  is not an opioid and is not addictive. It does not cause sleepiness or sedation.   Important! A teal colored band has been placed on your arm with the date, time and amount of EXPAREL  you have received. Please leave this armband in place for the full 96 hours following administration, and then you may remove the band. If you return  to the hospital for any reason within 96 hours following the administration of EXPAREL , the armband provides important information that your health care providers to know, and alerts them that you have received this anesthetic.    Possible side effects of EXPAREL : Temporary loss of sensation or ability to move in the area where medication was injected. Nausea, vomiting, constipation Rarely, numbness and tingling in your mouth or lips, lightheadedness, or anxiety may occur. Call your doctor right away if you think you may be experiencing any of these sensations, or if you have other questions regarding possible side effects.  Follow all other discharge instructions given to you by your surgeon or nurse. Eat a healthy diet and drink plenty of water  or other fluids.     Last received oxycodone at 105pm

## 2024-05-26 NOTE — Transfer of Care (Signed)
 Immediate Anesthesia Transfer of Care Note  Patient: Barbara Ortega  Procedure(s) Performed: BREAST REDUCTION WITH LIPOSUCTION (Bilateral: Breast)  Patient Location: PACU  Anesthesia Type:General  Level of Consciousness: drowsy  Airway & Oxygen Therapy: Patient Spontanous Breathing and Patient connected to face mask oxygen  Post-op Assessment: Report given to RN and Post -op Vital signs reviewed and stable  Post vital signs: Reviewed and stable  Last Vitals:  Vitals Value Taken Time  BP 115/79 05/26/24 11:45  Temp 97.3 F   Pulse 85 05/26/24 11:47  Resp 12 05/26/24 11:48  SpO2 100 % 05/26/24 11:47  Vitals shown include unfiled device data.  Last Pain:  Vitals:   05/26/24 0836  TempSrc: Temporal  PainSc: 0-No pain         Complications: No notable events documented.

## 2024-05-26 NOTE — Anesthesia Procedure Notes (Signed)
 Procedure Name: LMA Insertion Date/Time: 05/26/2024 9:31 AM  Performed by: Debarah Chiquita LABOR, CRNAPre-anesthesia Checklist: Patient identified, Emergency Drugs available, Suction available and Patient being monitored Patient Re-evaluated:Patient Re-evaluated prior to induction Oxygen Delivery Method: Circle system utilized Preoxygenation: Pre-oxygenation with 100% oxygen Induction Type: IV induction Ventilation: Mask ventilation without difficulty LMA: LMA inserted LMA Size: 4.0 Number of attempts: 1 Airway Equipment and Method: Bite block Placement Confirmation: positive ETCO2 Tube secured with: Tape Dental Injury: Teeth and Oropharynx as per pre-operative assessment

## 2024-05-26 NOTE — Anesthesia Preprocedure Evaluation (Signed)
"                                    Anesthesia Evaluation  Patient identified by MRN, date of birth, ID band Patient awake    Reviewed: Allergy & Precautions, H&P , NPO status , Patient's Chart, lab work & pertinent test results  History of Anesthesia Complications (+) PONV and history of anesthetic complications  Airway Mallampati: II   Neck ROM: full    Dental   Pulmonary  Alpha-1 antitrypsin deficiency   breath sounds clear to auscultation       Cardiovascular hypertension,  Rhythm:regular Rate:Normal     Neuro/Psych  Headaches  Neuromuscular disease    GI/Hepatic   Endo/Other    Renal/GU      Musculoskeletal   Abdominal   Peds  Hematology   Anesthesia Other Findings   Reproductive/Obstetrics                              Anesthesia Physical Anesthesia Plan  ASA: 2  Anesthesia Plan: General   Post-op Pain Management:    Induction: Intravenous  PONV Risk Score and Plan: 4 or greater and Ondansetron , Dexamethasone , Midazolam and Treatment may vary due to age or medical condition  Airway Management Planned: LMA  Additional Equipment:   Intra-op Plan:   Post-operative Plan: Extubation in OR  Informed Consent: I have reviewed the patients History and Physical, chart, labs and discussed the procedure including the risks, benefits and alternatives for the proposed anesthesia with the patient or authorized representative who has indicated his/her understanding and acceptance.     Dental advisory given  Plan Discussed with: CRNA, Anesthesiologist and Surgeon  Anesthesia Plan Comments:         Anesthesia Quick Evaluation  "

## 2024-05-26 NOTE — Op Note (Signed)
 Breast Reduction Op note:    DATE OF PROCEDURE: 05/26/2024  LOCATION: [facility]  SURGEON: Estefana Fritter, DO  ASSISTANT: Estefana Peck, PA  PREOPERATIVE DIAGNOSIS 1. Macromastia 2. Neck Pain 3. Back Pain  POSTOPERATIVE DIAGNOSIS 1. Macromastia 2. Neck Pain 3. Back Pain  PROCEDURES 1. Bilateral breast reduction.  Right reduction 962 g, Left reduction 980 g  COMPLICATIONS: None.  DRAINS: none  INDICATIONS FOR PROCEDURE Barbara Ortega is a 48 y.o. year-old female born on 10-Mar-1976,with a history of symptomatic macromastia with concomitant back pain, neck pain, shoulder grooving from her bra.   MRN: [id number]  CONSENT Informed consent was obtained directly from the patient. The risks, benefits and alternatives were fully discussed. Specific risks including but not limited to bleeding, infection, hematoma, seroma, scarring, pain, nipple necrosis, asymmetry, poor cosmetic results, and need for further surgery were discussed. The patient's questions were answered.  DESCRIPTION OF PROCEDURE  Patient was brought into the operating room and rested on the operating room table in the supine position.  SCDs were placed and appropriate padding was performed.  Antibiotics were given. The patient underwent general anesthesia and the chest was prepped and draped in a sterile fashion.  A timeout was performed and all information was confirmed to be correct by those in the room. Tumescent was placed in the lateral breast. Liposuction was done laterally on each breast.   Right side: Preoperative markings were confirmed.  Incision lines were injected with local containing epinephrine.  After waiting for vasoconstriction, the marked lines were incised with a #15 blade.  A Wise-pattern superomedial breast reduction was performed by de-epithelializing the pedicle, using bovie to create the superomedial pedicle, and removing breast tissue from the lateral and inferior  portions of the breast.  Experel and myriad were placed in the pocket. Care was taken to not undermine the breast pedicle. Hemostasis was achieved.  The nipple was gently rotated into position and the soft tissue closed with 4-0 Monocryl.   The pocket was irrigated and hemostasis confirmed.  The deep tissues were approximated with 3-0 PDS sutures.  The skin was closed with deep dermal 3-0 Monocryl and subcuticular 4-0 Monocryl sutures.  The nipple and skin flaps had good capillary refill at the end of the procedure.    Left side: Preoperative markings were confirmed.  Incision lines were injected with local containing epinephrine.  After waiting for vasoconstriction, the marked lines were incised with a #15 blade.  A Wise-pattern superomedial breast reduction was performed by de-epithelializing the pedicle, using bovie to create the superomedial pedicle, and removing breast tissue from the lateral and inferior portions of the breast.  Experel and myriad were placed in the pocket. Care was taken to not undermine the breast pedicle. Hemostasis was achieved.  The nipple was gently rotated into position and the soft tissue was closed with 4-0 Monocryl.  The patient was sat upright and size and shape symmetry was confirmed.  The pocket was irrigated and hemostasis confirmed.  The deep tissues were approximated with 3-0 PDS sutures. The skin was closed with deep dermal 3-0 Monocryl and subcuticular 4-0 Monocryl sutures.  Dermabond was applied.  A breast binder and ABDs were placed.  The nipple and skin flaps had good capillary refill at the end of the procedure.  The patient tolerated the procedure well. The patient was allowed to wake from anesthesia and taken to the recovery room in satisfactory condition.  The advanced practice practitioner (APP) assisted throughout the case.  The APP was essential in retraction and counter traction when needed to make the case progress smoothly.  This retraction and assistance  made it possible to see the tissue plans for the procedure.  The assistance was needed for blood control, tissue re-approximation and assisted with closure of the incision site.

## 2024-05-26 NOTE — Interval H&P Note (Signed)
 History and Physical Interval Note:  05/26/2024 8:57 AM  Barbara Ortega  has presented today for surgery, with the diagnosis of hypertrophy of breast.  The various methods of treatment have been discussed with the patient and family. After consideration of risks, benefits and other options for treatment, the patient has consented to  Procedures: BREAST REDUCTION WITH LIPOSUCTION (Bilateral) as a surgical intervention.  The patient's history has been reviewed, patient examined, no change in status, stable for surgery.  I have reviewed the patient's chart and labs.  Questions were answered to the patient's satisfaction.     Estefana RAMAN Moriya Mitchell

## 2024-05-27 NOTE — Anesthesia Postprocedure Evaluation (Signed)
"   Anesthesia Post Note  Patient: Barbara Ortega  Procedure(s) Performed: BREAST REDUCTION WITH LIPOSUCTION (Bilateral: Breast)     Patient location during evaluation: PACU Anesthesia Type: General Level of consciousness: awake and alert Pain management: pain level controlled Vital Signs Assessment: post-procedure vital signs reviewed and stable Respiratory status: spontaneous breathing, nonlabored ventilation, respiratory function stable and patient connected to nasal cannula oxygen Cardiovascular status: blood pressure returned to baseline and stable Postop Assessment: no apparent nausea or vomiting Anesthetic complications: no   No notable events documented.  Last Vitals:  Vitals:   05/26/24 1216 05/26/24 1254  BP: 124/80 122/83  Pulse: 77 68  Resp: 16 16  Temp:  (!) 36.2 C  SpO2: 95% 95%    Last Pain:  Vitals:   05/26/24 1303  TempSrc:   PainSc: 5                  Tiant Peixoto S      "

## 2024-05-28 ENCOUNTER — Telehealth: Payer: Self-pay | Admitting: Plastic Surgery

## 2024-05-28 ENCOUNTER — Encounter (HOSPITAL_BASED_OUTPATIENT_CLINIC_OR_DEPARTMENT_OTHER): Payer: Self-pay | Admitting: Plastic Surgery

## 2024-05-28 LAB — SURGICAL PATHOLOGY

## 2024-05-28 NOTE — Telephone Encounter (Signed)
 Provider out of office 06-04-24, pt needs to be r/s

## 2024-06-01 ENCOUNTER — Encounter: Payer: Self-pay | Admitting: Plastic Surgery

## 2024-06-01 ENCOUNTER — Ambulatory Visit (INDEPENDENT_AMBULATORY_CARE_PROVIDER_SITE_OTHER): Admitting: Plastic Surgery

## 2024-06-01 VITALS — BP 141/91 | HR 71 | Ht 63.0 in | Wt 182.4 lb

## 2024-06-01 DIAGNOSIS — N62 Hypertrophy of breast: Secondary | ICD-10-CM

## 2024-06-01 NOTE — Progress Notes (Signed)
" ° °  Subjective:    Patient ID: Nat Charlies Kocher, female    DOB: 1976-01-30, 49 y.o.   MRN: [id number]  The patient is a 49 year old female here for follow-up after undergoing bilateral breast reductions on December 31.  She had over 900 g removed from both breasts.  The she has quite a bit of bruising but no sign of infection, hematoma or seroma.      Review of Systems  Constitutional: Negative.   Eyes: Negative.   Respiratory: Negative.    Cardiovascular: Negative.        Objective:   Physical Exam Constitutional:      Appearance: Normal appearance.  Cardiovascular:     Rate and Rhythm: Normal rate.  Pulmonary:     Effort: Pulmonary effort is normal.  Neurological:     Mental Status: She is alert and oriented to person, place, and time.  Psychiatric:        Mood and Affect: Mood normal.        Behavior: Behavior normal.        Thought Content: Thought content normal.        Judgment: Judgment normal.        Assessment & Plan:     ICD-10-CM   1. Symptomatic mammary hypertrophy  N62       Pictures at next visit.  Continue with sports bra.  Will plan to see her back in 2 weeks.  "

## 2024-06-04 ENCOUNTER — Encounter: Admitting: Plastic Surgery

## 2024-06-10 ENCOUNTER — Encounter: Admitting: Nurse Practitioner

## 2024-06-15 ENCOUNTER — Ambulatory Visit: Admitting: Plastic Surgery

## 2024-06-15 ENCOUNTER — Encounter: Payer: Self-pay | Admitting: Plastic Surgery

## 2024-06-15 DIAGNOSIS — Z9889 Other specified postprocedural states: Secondary | ICD-10-CM

## 2024-06-15 DIAGNOSIS — N62 Hypertrophy of breast: Secondary | ICD-10-CM

## 2024-06-15 NOTE — Progress Notes (Signed)
 The patient is a 49 year old female here for follow-up after undergoing bilateral breast reduction.  She had 900 g removed from both breast.  She has a little bit of a scab on the left nipple.  She can put some Vaseline on there.  Continue with the sports bra and the lipo foam.  Follow-up as needed.  Pictures were obtained of the patient and placed in the chart with the patient's or guardian's permission.

## 2024-06-18 ENCOUNTER — Encounter: Admitting: Student

## 2024-06-18 ENCOUNTER — Encounter

## 2024-07-02 ENCOUNTER — Ambulatory Visit: Admitting: Student

## 2024-07-02 ENCOUNTER — Encounter: Admitting: Student

## 2024-07-02 DIAGNOSIS — N62 Hypertrophy of breast: Secondary | ICD-10-CM

## 2024-07-02 NOTE — Progress Notes (Cosign Needed)
 Patient is a 49 year old female who recently had breast reduction with Dr. Lowery on 05/26/2024.  Patient is a little over 5 weeks postop.  She presents to the clinic today for postoperative follow-up.  Patient was last seen in the clinic on 06/15/2024.  At this visit, patient had a little bit of a scab on the left nipple.  It was recommended she apply Vaseline over this area and continue with her sports bra.  Today, patient reports she is doing well.  She denies any new issues or concerns today.  She denies any fevers or chills.  She reports the Vaseline has helped significantly the scabbing over her left nipple.  Chaperone present on exam.  On exam, patient is sitting upright in no acute distress.  Breasts are overall soft and symmetric.  There is a little bit of firmness noted to the lateral breast bilaterally consistent with scar tissue.  There is no overlying skin changes.  There is no overlying erythema to either breast.  No obvious fluid collections on exam.  NAC's appear to be healthy bilaterally.  Steri-Strips are in place over the incisions.  These were removed.  Incisions appear to be intact and healing well.  There is some residual Dermabond noted throughout.  There were a few Monocryl sutures that were cut and removed.  Patient tolerated well.  There are no signs of infection on exam.  Recommended that patient apply Vaseline to her incisions throughout for the next 2 to 3 weeks.  Discussed with her that once all of the residual Dermabond has come off, she may transition to scar creams or tapes if she would like.  Patient expressed understanding.  Discussed with the patient that as of next week, she may transition into a regular bra with no underwire.  Discussed with her that she may start increasing her activities at that time.  Patient expressed understanding.  Discussed with the patient to gently massage the areas of firmness to her breast.  Discussed with her to monitor these areas.   Discussed that they should soften off and become smaller.  Discussed with her that if they persist or become larger, she needs to let us  know.  Patient expressed understanding.  Patient to follow-up as needed.  Instructed the patient to call with any questions or concerns.  Pictures were obtained of the patient and placed in the chart with the patient's or guardian's permission.

## 2024-11-19 ENCOUNTER — Ambulatory Visit: Admitting: Nurse Practitioner
